# Patient Record
Sex: Female | Born: 1970 | Race: White | Hispanic: No | Smoking: Never smoker
Health system: Southern US, Community
[De-identification: ages and names within clinical notes are randomized; demographics above are authoritative.]

## PROBLEM LIST (undated history)

## (undated) DIAGNOSIS — R55 Syncope and collapse: Secondary | ICD-10-CM

## (undated) DIAGNOSIS — G43909 Migraine, unspecified, not intractable, without status migrainosus: Secondary | ICD-10-CM

## (undated) DIAGNOSIS — F329 Major depressive disorder, single episode, unspecified: Secondary | ICD-10-CM

## (undated) DIAGNOSIS — K900 Celiac disease: Secondary | ICD-10-CM

## (undated) HISTORY — DX: Major depressive disorder, single episode, unspecified: F32.9

## (undated) HISTORY — DX: Syncope and collapse: R55

## (undated) HISTORY — DX: Celiac disease: K90.0

## (undated) HISTORY — PX: CHOLECYSTECTOMY: SHX55

## (undated) HISTORY — DX: Migraine, unspecified, not intractable, without status migrainosus: G43.909

---

## 1998-04-26 ENCOUNTER — Other Ambulatory Visit: Admission: RE | Admit: 1998-04-26 | Discharge: 1998-04-26 | Payer: Self-pay | Admitting: Obstetrics and Gynecology

## 1999-06-27 ENCOUNTER — Other Ambulatory Visit: Admission: RE | Admit: 1999-06-27 | Discharge: 1999-06-27 | Payer: Self-pay | Admitting: Obstetrics and Gynecology

## 2000-09-05 ENCOUNTER — Other Ambulatory Visit: Admission: RE | Admit: 2000-09-05 | Discharge: 2000-09-05 | Payer: Self-pay | Admitting: Obstetrics and Gynecology

## 2001-10-06 ENCOUNTER — Other Ambulatory Visit: Admission: RE | Admit: 2001-10-06 | Discharge: 2001-10-06 | Payer: Self-pay | Admitting: Obstetrics and Gynecology

## 2002-09-10 ENCOUNTER — Other Ambulatory Visit: Admission: RE | Admit: 2002-09-10 | Discharge: 2002-09-10 | Payer: Self-pay | Admitting: Obstetrics and Gynecology

## 2002-12-22 ENCOUNTER — Other Ambulatory Visit: Admission: RE | Admit: 2002-12-22 | Discharge: 2002-12-22 | Payer: Self-pay | Admitting: Obstetrics and Gynecology

## 2004-04-24 ENCOUNTER — Other Ambulatory Visit: Admission: RE | Admit: 2004-04-24 | Discharge: 2004-04-24 | Payer: Self-pay | Admitting: Obstetrics and Gynecology

## 2005-01-09 ENCOUNTER — Ambulatory Visit (HOSPITAL_COMMUNITY): Admission: RE | Admit: 2005-01-09 | Discharge: 2005-01-09 | Payer: Self-pay | Admitting: Pediatrics

## 2005-02-08 ENCOUNTER — Encounter: Admission: RE | Admit: 2005-02-08 | Discharge: 2005-02-08 | Payer: Self-pay | Admitting: Gastroenterology

## 2005-02-12 ENCOUNTER — Encounter: Admission: RE | Admit: 2005-02-12 | Discharge: 2005-02-12 | Payer: Self-pay | Admitting: Gastroenterology

## 2005-02-19 ENCOUNTER — Ambulatory Visit (HOSPITAL_COMMUNITY): Admission: RE | Admit: 2005-02-19 | Discharge: 2005-02-19 | Payer: Self-pay | Admitting: Gastroenterology

## 2005-02-19 ENCOUNTER — Encounter (INDEPENDENT_AMBULATORY_CARE_PROVIDER_SITE_OTHER): Payer: Self-pay | Admitting: Specialist

## 2005-04-03 ENCOUNTER — Encounter: Admission: RE | Admit: 2005-04-03 | Discharge: 2005-04-03 | Payer: Self-pay | Admitting: Gastroenterology

## 2005-04-05 ENCOUNTER — Other Ambulatory Visit: Admission: RE | Admit: 2005-04-05 | Discharge: 2005-04-05 | Payer: Self-pay | Admitting: Obstetrics and Gynecology

## 2005-05-03 ENCOUNTER — Encounter: Admission: RE | Admit: 2005-05-03 | Discharge: 2005-05-03 | Payer: Self-pay | Admitting: Obstetrics and Gynecology

## 2005-05-10 ENCOUNTER — Encounter: Admission: RE | Admit: 2005-05-10 | Discharge: 2005-05-10 | Payer: Self-pay | Admitting: Gastroenterology

## 2005-11-05 ENCOUNTER — Encounter: Admission: RE | Admit: 2005-11-05 | Discharge: 2005-11-05 | Payer: Self-pay | Admitting: Obstetrics and Gynecology

## 2005-12-25 ENCOUNTER — Emergency Department (HOSPITAL_COMMUNITY): Admission: EM | Admit: 2005-12-25 | Discharge: 2005-12-25 | Payer: Self-pay | Admitting: Emergency Medicine

## 2005-12-28 ENCOUNTER — Emergency Department (HOSPITAL_COMMUNITY): Admission: EM | Admit: 2005-12-28 | Discharge: 2005-12-28 | Payer: Self-pay | Admitting: Emergency Medicine

## 2006-01-01 ENCOUNTER — Encounter (HOSPITAL_COMMUNITY): Admission: RE | Admit: 2006-01-01 | Discharge: 2006-02-05 | Payer: Self-pay | Admitting: Emergency Medicine

## 2006-02-12 ENCOUNTER — Other Ambulatory Visit: Admission: RE | Admit: 2006-02-12 | Discharge: 2006-02-12 | Payer: Self-pay | Admitting: Obstetrics and Gynecology

## 2006-04-09 ENCOUNTER — Other Ambulatory Visit: Admission: RE | Admit: 2006-04-09 | Discharge: 2006-04-09 | Payer: Self-pay | Admitting: Obstetrics and Gynecology

## 2006-05-16 ENCOUNTER — Encounter: Admission: RE | Admit: 2006-05-16 | Discharge: 2006-05-16 | Payer: Self-pay | Admitting: Obstetrics and Gynecology

## 2006-05-20 LAB — CONVERTED CEMR LAB: Pap Smear: NORMAL

## 2007-02-12 ENCOUNTER — Encounter: Payer: Self-pay | Admitting: Family Medicine

## 2008-08-31 ENCOUNTER — Ambulatory Visit: Payer: Self-pay | Admitting: Family Medicine

## 2008-08-31 DIAGNOSIS — F329 Major depressive disorder, single episode, unspecified: Secondary | ICD-10-CM

## 2008-08-31 DIAGNOSIS — F3289 Other specified depressive episodes: Secondary | ICD-10-CM | POA: Insufficient documentation

## 2008-08-31 HISTORY — DX: Major depressive disorder, single episode, unspecified: F32.9

## 2008-08-31 HISTORY — DX: Other specified depressive episodes: F32.89

## 2008-10-13 ENCOUNTER — Encounter: Payer: Self-pay | Admitting: Family Medicine

## 2008-10-13 DIAGNOSIS — K900 Celiac disease: Secondary | ICD-10-CM

## 2008-10-13 DIAGNOSIS — G43909 Migraine, unspecified, not intractable, without status migrainosus: Secondary | ICD-10-CM

## 2008-10-13 HISTORY — DX: Migraine, unspecified, not intractable, without status migrainosus: G43.909

## 2008-10-13 HISTORY — DX: Celiac disease: K90.0

## 2009-08-14 ENCOUNTER — Encounter: Admission: RE | Admit: 2009-08-14 | Discharge: 2009-08-14 | Payer: Self-pay | Admitting: Obstetrics and Gynecology

## 2010-03-12 ENCOUNTER — Telehealth: Payer: Self-pay | Admitting: Family Medicine

## 2010-03-12 ENCOUNTER — Ambulatory Visit: Payer: Self-pay | Admitting: Family Medicine

## 2010-03-25 ENCOUNTER — Emergency Department (HOSPITAL_COMMUNITY)
Admission: EM | Admit: 2010-03-25 | Discharge: 2010-03-25 | Payer: Self-pay | Source: Home / Self Care | Admitting: Emergency Medicine

## 2010-03-29 ENCOUNTER — Ambulatory Visit: Payer: Self-pay | Admitting: Family Medicine

## 2010-03-29 DIAGNOSIS — R55 Syncope and collapse: Secondary | ICD-10-CM | POA: Insufficient documentation

## 2010-03-29 HISTORY — DX: Syncope and collapse: R55

## 2010-06-10 ENCOUNTER — Encounter: Payer: Self-pay | Admitting: Obstetrics and Gynecology

## 2010-06-19 NOTE — Assessment & Plan Note (Signed)
Summary: POST ED VISIT (SYNCOPAL EPISODE) // RS   Vital Signs:  Patient profile:   40 year old female Weight:      150 pounds Temp:     98.3 degrees F oral BP sitting:   90 / 62  (left arm) BP standing:   96 / 68 Cuff size:   regular  Vitals Entered By: Sid Falcon LPN (March 29, 2010 1:32 PM)  Serial Vital Signs/Assessments:  Time      Position  BP       Pulse  Resp  Temp     By           Lying RA  100/68                         Evelena Peat MD           Standing  96/68                          Evelena Peat MD   History of Present Illness: Patient seen following recent syncopal episode. This occurred early November 6 sometime after midnight. Patient recalls getting up to go for a drink of water. She was in kitchen getting a glass and sometime between there and walking out into the hallway she lost consciousness. She does recall feeling dizzy, nauseated, and diaphoretic before passing out. Length of unconsciousness unknown but she thinks very brief.   When she came back around she had lower lip laceration and drywall pieces in her mouth. She eventually went back to bed and went to the emergency room later that morning around 9:30 AM. She had extensive workup as below  Patient had several labs all unremarkable-CBC, chemistries, CK enz.   CT scan of head and CT maxillofacial films unremarkable for any fracture or acute bleed.          no major dental injury.  no arrhythmias noted. No history of seizure. She doesn't recall any orthostatic changes. She tends to have low blood pressure. Does not take any regular medications.  She has done well since the injury. No recurrent symptoms.  Allergies: 1)  ! Hydrocodone-Acetaminophen (Hydrocodone-Acetaminophen)  Past History:  Past Medical History: Last updated: 10/13/2008 Depression Migraines celiac disease  Past Surgical History: Last updated: 08/31/2008 C-Section 1993  Family History: Last updated: 08/31/2008 Family  History Hypertension  parent Family History of Cardiovascular disorder  parent Breast cancer grandmother  Social History: Last updated: 08/31/2008 Occupation:  Fish farm manager Single Alcohol use-no PMH-FH-SH reviewed for relevance  Review of Systems  The patient denies anorexia, fever, weight loss, vision loss, chest pain, dyspnea on exertion, peripheral edema, prolonged cough, headaches, hemoptysis, abdominal pain, melena, hematochezia, and severe indigestion/heartburn.    Physical Exam  General:  Well-developed,well-nourished,in no acute distress; alert,appropriate and cooperative throughout examination Head:  Normocephalic and atraumatic without obvious abnormalities. No apparent alopecia or balding. Eyes:  pupils equal, pupils round, and pupils reactive to light.   Ears:  External ear exam shows no significant lesions or deformities.  Otoscopic examination reveals clear canals, tympanic membranes are intact bilaterally without bulging, retraction, inflammation or discharge. Hearing is grossly normal bilaterally. Mouth:  lower lip laceration healing well no signs of secondary infection. Neck:  No deformities, masses, or tenderness noted. Lungs:  Normal respiratory effort, chest expands symmetrically. Lungs are clear to auscultation, no crackles or wheezes. Heart:  normal rate, regular rhythm, no murmur, and no gallop.  Msk:  No deformity or scoliosis noted of thoracic or lumbar spine.   Extremities:  No clubbing, cyanosis, edema, or deformity noted with normal full range of motion of all joints.   Neurologic:  alert & oriented X3, cranial nerves II-XII intact, strength normal in all extremities, sensation intact to light touch, and DTRs symmetrical and normal.   Cervical Nodes:  No lymphadenopathy noted Psych:  normally interactive, good eye contact, not anxious appearing, and not depressed appearing.     Impression & Recommendations:  Problem # 1:  SYNCOPE  (ICD-780.2) ?vasovagal with preceeding symptoms of nausea, diaphoresis.  No recurrrence.  Nothing to sound like seizure. Workup reviewed.  pt to follow up immediatley for any recurrence.  Complete Medication List: 1)  Multi-b-plus Tabs (Multiple vitamins-minerals) .... Once daily 2)  Melatonin 3 Mg Caps (Melatonin) .... At bedtime 3)  Calcium + D 315-200 Mg-unit Tabs (Calcium citrate-vitamin d) .... Qd 4)  Flax Oil-fish Oil-borage Oil Caps (Flax oil-fish oil-borage oil) .... Once daily 5)  Amoxicillin-pot Clavulanate 875-125 Mg Tabs (Amoxicillin-pot clavulanate) .... One by mouth two times a day for 10 days   Orders Added: 1)  Est. Patient Level IV [16109]

## 2010-06-19 NOTE — Assessment & Plan Note (Signed)
Summary: Dog Bite/cb   Vital Signs:  Patient profile:   40 year old female Temp:     98.9 degrees F oral BP sitting:   110 / 80  (left arm) Cuff size:   regular  Vitals Entered By: Sid Falcon LPN (March 12, 2010 11:45 AM) CC: Walk-in, dog bite left forearm   History of Present Illness: Dog bite L wrist.  She works in Scientist, product/process development. Dog coming out of anesthesia.  Pt and dog have been vaccinated-for rabies. Deep puncture wound .  Last tetanus 2 years ago. Initial bleeding, now controlled.  Allergies: 1)  ! Hydrocodone-Acetaminophen (Hydrocodone-Acetaminophen)  Past History:  Past Medical History: Last updated: 10/13/2008 Depression Migraines celiac disease PMH reviewed for relevance  Physical Exam  General:  Well-developed,well-nourished,in no acute distress; alert,appropriate and cooperative throughout examination Lungs:  Normal respiratory effort, chest expands symmetrically. Lungs are clear to auscultation, no crackles or wheezes. Heart:  Normal rate and regular rhythm. S1 and S2 normal without gallop, murmur, click, rub or other extra sounds. Extremities:  L forearm about 8 cm prox to wrist has small <1cm nongaping horizontal laceration with no active bleeding.  Does have some mild bruising and edema. Full ROM wrist and all digits of hand. Neurologic:  sensation intact to light touch.     Impression & Recommendations:  Problem # 1:  DOG BITE (ICD-E906.0) no concerns for rabies (see HPI).  immunizations up to date.  Wound is thouroughly irrigated with normal saline and topical  antibiotic and dressing applied.  Watch closely for infection.  Start Augmentin promptly for any signs of infection. We explained rationale for not suturing since nongaping and fairly deep puncture wound.  Complete Medication List: 1)  Multi-b-plus Tabs (Multiple vitamins-minerals) .... Once daily 2)  Melatonin 3 Mg Caps (Melatonin) .... At bedtime 3)  Calcium + D 315-200 Mg-unit  Tabs (Calcium citrate-vitamin d) .... Qd 4)  Flax Oil-fish Oil-borage Oil Caps (Flax oil-fish oil-borage oil) .... Once daily 5)  Amoxicillin-pot Clavulanate 875-125 Mg Tabs (Amoxicillin-pot clavulanate) .... One by mouth two times a day for 10 days Prescriptions: AMOXICILLIN-POT CLAVULANATE 875-125 MG TABS (AMOXICILLIN-POT CLAVULANATE) one by mouth two times a day for 10 days  #20 x 0   Entered and Authorized by:   Evelena Peat MD   Signed by:   Evelena Peat MD on 03/12/2010   Method used:   Print then Give to Patient   RxID:   [id number]    Orders Added: 1)  Est. Patient Level III [14782]

## 2010-06-19 NOTE — Progress Notes (Signed)
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   Left message we returned call.  Rudy Jew, RN  March 12, 2010 11:19 AM  Initial call taken by: Rudy Jew, RN,  March 12, 2010 11:17 AM  Follow-up for Phone Call        Pt arrived for acute OV Follow-up by: Sid Falcon LPN,  March 12, 2010 12:12 PM

## 2010-07-31 LAB — DIFFERENTIAL
Basophils Absolute: 0 10*3/uL (ref 0.0–0.1)
Basophils Relative: 0 % (ref 0–1)
Eosinophils Absolute: 0.1 10*3/uL (ref 0.0–0.7)
Neutrophils Relative %: 67 % (ref 43–77)

## 2010-07-31 LAB — COMPREHENSIVE METABOLIC PANEL
ALT: 18 U/L (ref 0–35)
Alkaline Phosphatase: 52 U/L (ref 39–117)
BUN: 11 mg/dL (ref 6–23)
CO2: 27 mEq/L (ref 19–32)
GFR calc non Af Amer: >60 mL/min (ref >60)
Glucose, Bld: 99 mg/dL (ref 70–99)
Potassium: 3.6 mEq/L (ref 3.5–5.1)
Sodium: 140 mEq/L (ref 135–145)
Total Bilirubin: 0.8 mg/dL (ref 0.3–1.2)

## 2010-07-31 LAB — URINALYSIS, ROUTINE W REFLEX MICROSCOPIC
Glucose, UA: NEGATIVE mg/dL
Ketones, ur: NEGATIVE mg/dL
Leukocytes, UA: NEGATIVE
Protein, ur: NEGATIVE mg/dL
Urobilinogen, UA: 0.2 mg/dL (ref 0.0–1.0)

## 2010-07-31 LAB — POCT CARDIAC MARKERS: Myoglobin, poc: 54.1 ng/mL (ref 12–200)

## 2010-07-31 LAB — CBC
HCT: 41.4 % (ref 36.0–46.0)
Hemoglobin: 14.4 g/dL (ref 12.0–15.0)
MCV: 91.6 fL (ref 78.0–100.0)
WBC: 7.3 10*3/uL (ref 4.0–10.5)

## 2010-07-31 LAB — URINE MICROSCOPIC-ADD ON

## 2010-08-03 ENCOUNTER — Telehealth: Payer: Self-pay | Admitting: *Deleted

## 2010-08-03 NOTE — Telephone Encounter (Signed)
patient  Is calling because she has been exposed to a rabid dog 08-01-10.  She did receive vaccination 2007.  Should she be tested or re-immunized?

## 2010-08-05 NOTE — Telephone Encounter (Signed)
I attempted to call pt and discuss on 08-03-10 but unable to reach.  I left message on machine.  Need more details regarding her "exposure".  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

## 2010-08-06 NOTE — Telephone Encounter (Signed)
Order written for pt

## 2010-08-06 NOTE — Telephone Encounter (Signed)
2nd message left on pt cell phone to call with additional information, or may schedule OV to evaluate further

## 2010-08-06 NOTE — Telephone Encounter (Signed)
Pt here with daughter for daughter OV, pt requesting order to draw a rabies titer, written needed for [facility]

## 2010-08-07 ENCOUNTER — Other Ambulatory Visit: Payer: Self-pay | Admitting: Family Medicine

## 2010-08-07 ENCOUNTER — Telehealth: Payer: Self-pay | Admitting: Family Medicine

## 2010-08-07 NOTE — Telephone Encounter (Signed)
Harland German is needing to get dx code for rabies titer. Need asap. Can't send out until it is rcvd.

## 2010-08-08 NOTE — Telephone Encounter (Signed)
Use ICD-9 code 879.8

## 2010-08-08 NOTE — Telephone Encounter (Signed)
Tried to inform Lab, however there already sent the specimin to lab yesterday.  Tried to provide IDC code to Customer Service, on hold for 10 min, no luck.

## 2010-08-08 NOTE — Telephone Encounter (Signed)
Please advise 

## 2010-08-22 ENCOUNTER — Telehealth: Payer: Self-pay | Admitting: *Deleted

## 2010-08-22 NOTE — Telephone Encounter (Signed)
I called the patient to let her know the TAT on this test is 30 days.

## 2010-08-22 NOTE — Telephone Encounter (Signed)
Gwen, can you see if this has been completed?  I thought I had seen a result but cannot find.

## 2010-08-22 NOTE — Telephone Encounter (Signed)
Pt is calling to ask for lab results for rabies titre?

## 2010-09-06 NOTE — Progress Notes (Signed)
Quick Note:  Pt informed ______ 

## 2010-09-06 NOTE — Progress Notes (Signed)
Quick Note:  Copy of lab result sent to pt home ______

## 2010-09-10 ENCOUNTER — Telehealth: Payer: Self-pay | Admitting: *Deleted

## 2010-09-10 NOTE — Telephone Encounter (Signed)
Tried to call pt back, left a message. Advise please

## 2010-09-10 NOTE — Telephone Encounter (Addendum)
Pt is calling to ask if Dr. Caryl Never would administer rabies injection to her????  She could bring it from her vet clinic??  Would like to speak to [employer].

## 2010-09-11 NOTE — Telephone Encounter (Signed)
I spoke with office manager Cinda Quest.  We are not set up to provide this service to pt.  Recommended she call an urgent care or Occupational Health facility to assist with the rabies vaccine.  Detailed message left on pt cell phone VM

## 2010-10-05 NOTE — Op Note (Signed)
NAMEPrisilla, Amy Kaufman                  ACCOUNT NO.:  [id number]   MEDICAL RECORD NO.:  [id number]          PATIENT TYPE:  AMB   LOCATION:  ENDO                         FACILITY:  [facility]   PHYSICIAN:  Petra Kuba, M.D.    DATE OF BIRTH:  Jul 28, 1970   DATE OF PROCEDURE:  02/19/2005  DATE OF DISCHARGE:                                 OPERATIVE REPORT   PROCEDURE:  EGD with biopsy.   INDICATIONS:  Abdominal pain not responding to usual medicines.  Consent was  signed after risks, benefits, methods, options thoroughly discussed in the  office.   MEDICINES USED:  Demerol 100, Versed 10.   PROCEDURE:  The video endoscope was inserted by direct vision.  Esophagus  was normal.  Scope passed into the stomach.  There was a tiny hiatal hernia  seen.  Some bilious material was suctioned.  The scope passed through a  normal antrum, normal pylorus, into a normal duodenal bulb and around the C-  loop to a second portion of the duodenum which did have some mild  scalloping, and a few biopsies were obtained.  We could not advance any  further down the duodenum due to looping.  The scope was slowly withdrawn.  Again, a good look at the bulb was normal.  Scope was withdrawn back to the  stomach and retroflexed.  Angularis, cardia, fundus, lesser and greater  curve were all normal except for some minimal gastritis.  Straight  visualization of the stomach did not reveal any additional findings.  A few  biopsies of the stomach, both the antrum and proximal stomach were obtained  to rule out Helicobacter.  Air was suctioned, scope slowly withdrawn.  Again, a good look at the esophagus was normal.  The scope was removed.  The  patient tolerated the procedure well, and there was no obvious immediate  complication.   ENDOSCOPIC DIAGNOSES:  1.  Tiny hiatal hernia.  2.  Minimal gastritis, status post biopsy.  3.  Minimal duodenitis versus scalloping, status post biopsy.   PLAN:  Await pathology.  Consider  laparoscope next or rechecking CT to see  if the nodes are getting better.  She has already tried Carafate and some  pump inhibitors __________.  The Librax seems to be taking the edge off.  Might need to try some Cytotec or possibly even a nonsteroidal pump  inhibitor to see if this is some abdominal wall inflammation.  Will touch  base about how she is doing with the biopsies and decide how to proceed at  that juncture.           ______________________________  Petra Kuba, M.D.     MEM/MEDQ  D:  02/19/2005  T:  02/19/2005  Job:  [id number]   cc:   Francoise Schaumann. Halford Chessman  Fax: [phone]

## 2011-04-26 ENCOUNTER — Ambulatory Visit (INDEPENDENT_AMBULATORY_CARE_PROVIDER_SITE_OTHER): Payer: 59 | Admitting: Family Medicine

## 2011-04-26 DIAGNOSIS — Z23 Encounter for immunization: Secondary | ICD-10-CM

## 2011-09-05 ENCOUNTER — Encounter: Payer: Self-pay | Admitting: Family Medicine

## 2011-09-05 ENCOUNTER — Ambulatory Visit (INDEPENDENT_AMBULATORY_CARE_PROVIDER_SITE_OTHER): Payer: 59 | Admitting: Family Medicine

## 2011-09-05 DIAGNOSIS — R252 Cramp and spasm: Secondary | ICD-10-CM

## 2011-09-05 DIAGNOSIS — R110 Nausea: Secondary | ICD-10-CM

## 2011-09-05 DIAGNOSIS — R109 Unspecified abdominal pain: Secondary | ICD-10-CM

## 2011-09-05 LAB — LIPASE: Lipase: 28 U/L (ref 11.0–59.0)

## 2011-09-05 LAB — BASIC METABOLIC PANEL
BUN: 12 mg/dL (ref 6–23)
Calcium: 8.5 mg/dL (ref 8.4–10.5)
Creatinine, Ser: 0.6 mg/dL (ref 0.4–1.2)
GFR: 107.52 mL/min (ref >60.00)

## 2011-09-05 LAB — CBC WITH DIFFERENTIAL/PLATELET
Basophils Absolute: 0.1 10*3/uL (ref 0.0–0.1)
Eosinophils Relative: 3 % (ref 0.0–5.0)
Monocytes Relative: 11.5 % (ref 3.0–12.0)
Neutrophils Relative %: 58.1 % (ref 43.0–77.0)
Platelets: 249 10*3/uL (ref 150.0–400.0)
RDW: 13 % (ref 11.5–14.6)
WBC: 7.2 10*3/uL (ref 4.5–10.5)

## 2011-09-05 LAB — MAGNESIUM: Magnesium: 1.9 mg/dL (ref 1.5–2.5)

## 2011-09-05 MED ORDER — ONDANSETRON 8 MG PO TBDP: 8.0000 mg | ORAL_TABLET | Freq: Three times a day (TID) | ORAL | Status: AC | PRN

## 2011-09-05 NOTE — Progress Notes (Signed)
  Subjective:    Patient ID: Amy Kaufman, female    DOB: 11/15/1970, 41 y.o.   MRN: [id number]  HPI  Acute visit. Onset 4 days ago of nausea and had vomiting on Monday and Tuesday but none since then. Her nausea comes in waves. She had diarrhea initially with very dark also black stools. Possible mild change in consistency. Diarrhea is now resolved. She also had for past month or so frequently cramps especially right leg. Good fluid consumption. She does not take any regular medications. No sick exposures. Denies any fever but has had some sweats off and on.  She has had some mild midepigastric pain. Previous cholecystectomy. No history of peptic ulcer disease. She has history of celiac disease.  Past Medical History  Diagnosis Date  . DEPRESSION 08/31/2008    Qualifier: Diagnosis of  By: Gabriel Rung LPN, Harriett Sine    . MIGRAINE HEADACHE 10/13/2008    Qualifier: Diagnosis of  By: Everett Graff    . Celiac disease 10/13/2008    Qualifier: Diagnosis of  By: Everett Graff    . SYNCOPE 03/29/2010    Qualifier: Diagnosis of  By: Caryl Never MD, Bruce     Past Surgical History  Procedure Date  . Cesarean section     reports that she has never smoked. She does not have any smokeless tobacco history on file. Her alcohol and drug histories not on file. family history includes Cancer in her other; Heart disease in her other; and Hypertension in her other. Allergies  Allergen Reactions  . Hydrocodone-Acetaminophen     iitching      Review of Systems  Constitutional: Positive for diaphoresis, appetite change and fatigue. Negative for fever and unexpected weight change.  Respiratory: Negative for cough and shortness of breath.   Gastrointestinal: Positive for nausea and abdominal pain. Negative for blood in stool.  Genitourinary: Negative for dysuria.  Skin: Negative for rash.  Neurological: Negative for dizziness and syncope.  Hematological: Negative for adenopathy.       Objective:   Physical Exam  Constitutional: She appears well-developed and well-nourished.  Neck: Neck supple.  Cardiovascular: Normal rate and regular rhythm.   Pulmonary/Chest: Effort normal and breath sounds normal. No respiratory distress. She has no wheezes. She has no rales.  Abdominal: Soft. She exhibits no distension and no mass. There is no rebound and no guarding.       Mildly tender epigastric region.  Genitourinary:       Rectal exam reveals no mass. Brown Hemoccult negative stool  Musculoskeletal: She exhibits no edema.  Lymphadenopathy:    She has no cervical adenopathy.          Assessment & Plan:  #1 nausea with resolving diarrhea. Question gastroenteritis. Zofran as needed for nausea. Obtain screening lab work with a recent vomiting and diarrhea and extreme fatigue  #2 leg cramps. Obtain electrolytes as above include magnesium. These symptoms have preceded her recent GI symptoms. She appears to be hydrating well. #3 epigastric pain possibly related to #1. Doubt pancreatitis. Doubt peptic ulcer disease with patient having no significant risk factors. She's had previous cholecystectomy

## 2011-09-05 NOTE — Patient Instructions (Signed)
Viral Gastroenteritis Viral gastroenteritis is also known as stomach flu. This condition affects the stomach and intestinal tract. It can cause sudden diarrhea and vomiting. The illness typically lasts 3 to 8 days. Most people develop an immune response that eventually gets rid of the virus. While this natural response develops, the virus can make you quite ill. CAUSES  Many different viruses can cause gastroenteritis, such as rotavirus or noroviruses. You can catch one of these viruses by consuming contaminated food or water. You may also catch a virus by sharing utensils or other personal items with an infected person or by touching a contaminated surface. SYMPTOMS  The most common symptoms are diarrhea and vomiting. These problems can cause a severe loss of body fluids (dehydration) and a body salt (electrolyte) imbalance. Other symptoms may include:  Fever.   Headache.   Fatigue.   Abdominal pain.  DIAGNOSIS  Your caregiver can usually diagnose viral gastroenteritis based on your symptoms and a physical exam. A stool sample may also be taken to test for the presence of viruses or other infections. TREATMENT  This illness typically goes away on its own. Treatments are aimed at rehydration. The most serious cases of viral gastroenteritis involve vomiting so severely that you are not able to keep fluids down. In these cases, fluids must be given through an intravenous line (IV). HOME CARE INSTRUCTIONS   Drink enough fluids to keep your urine clear or pale yellow. Drink small amounts of fluids frequently and increase the amounts as tolerated.   Ask your caregiver for specific rehydration instructions.   Avoid:   Foods high in sugar.   Alcohol.   Carbonated drinks.   Tobacco.   Juice.   Caffeine drinks.   Extremely hot or cold fluids.   Fatty, greasy foods.   Too much intake of anything at one time.   Dairy products until 24 to 48 hours after diarrhea stops.   You may  consume probiotics. Probiotics are active cultures of beneficial bacteria. They may lessen the amount and number of diarrheal stools in adults. Probiotics can be found in yogurt with active cultures and in supplements.   Wash your hands well to avoid spreading the virus.   Only take over-the-counter or prescription medicines for pain, discomfort, or fever as directed by your caregiver. Do not give aspirin to children. Antidiarrheal medicines are not recommended.   Ask your caregiver if you should continue to take your regular prescribed and over-the-counter medicines.   Keep all follow-up appointments as directed by your caregiver.  SEEK IMMEDIATE MEDICAL CARE IF:   You are unable to keep fluids down.   You do not urinate at least once every 6 to 8 hours.   You develop shortness of breath.   You notice blood in your stool or vomit. This may look like coffee grounds.   You have abdominal pain that increases or is concentrated in one small area (localized).   You have persistent vomiting or diarrhea.   You have a fever.   The patient is a child younger than 3 months, and he or she has a fever.   The patient is a child older than 3 months, and he or she has a fever and persistent symptoms.   The patient is a child older than 3 months, and he or she has a fever and symptoms suddenly get worse.   The patient is a baby, and he or she has no tears when crying.  MAKE SURE YOU:     Understand these instructions.   Will watch your condition.   Will get help right away if you are not doing well or get worse.  Document Released: 05/06/2005 Document Revised: 04/25/2011 Document Reviewed: 02/20/2011 [employer] Patient Information 2012 [employer]. 

## 2011-09-06 NOTE — Progress Notes (Signed)
Quick Note:  Pt informed ______ 

## 2011-09-17 ENCOUNTER — Ambulatory Visit (INDEPENDENT_AMBULATORY_CARE_PROVIDER_SITE_OTHER): Payer: 59 | Admitting: Family Medicine

## 2011-09-17 ENCOUNTER — Encounter: Payer: Self-pay | Admitting: Family Medicine

## 2011-09-17 VITALS — BP 90/62 | Temp 99.2°F | Wt 145.0 lb

## 2011-09-17 DIAGNOSIS — R1013 Epigastric pain: Secondary | ICD-10-CM

## 2011-09-17 DIAGNOSIS — K900 Celiac disease: Secondary | ICD-10-CM

## 2011-09-17 MED ORDER — PANTOPRAZOLE SODIUM 40 MG PO TBEC: 40.0000 mg | DELAYED_RELEASE_TABLET | Freq: Every day | ORAL | Status: DC

## 2011-09-17 NOTE — Progress Notes (Addendum)
  Subjective:    Patient ID: Amy Kaufman, female    DOB: 07-11-1970, 41 y.o.   MRN: [id number]  HPI  Patient seen with persistent somewhat intermittent epigastric pain. She had what sounded like gastroenteritis couple weeks ago. She's had some persistent diarrhea which is intermittent and nonbloody. She has sharp to burning epigastric pain. No clear aggravating factors. No alleviating factors. Has not tried any antacids. History of celiac disease and had somewhat similar flareup several years ago when first diagnosed. She has followed gluten-free diet for the most part. Has had good appetite. No recent antibiotics. Denies any fever. No hematemesis. Still has some nausea occasionally but no vomiting. Previous cholecystectomy.  Is taking Zofran which does help occasionally with nausea. Probiotics have not helped her symptoms.  Location of pain is epigastric. No radiation.  Recent lab work including basic metabolic panel, CBC, and lipase were unremarkable.  Past Medical History  Diagnosis Date  . DEPRESSION 08/31/2008    Qualifier: Diagnosis of  By: Gabriel Rung LPN, Harriett Sine    . MIGRAINE HEADACHE 10/13/2008    Qualifier: Diagnosis of  By: Everett Graff    . Celiac disease 10/13/2008    Qualifier: Diagnosis of  By: Everett Graff    . SYNCOPE 03/29/2010    Qualifier: Diagnosis of  By: Caryl Never MD, Suzi Hernan     Past Surgical History  Procedure Date  . Cesarean section     reports that she has never smoked. She does not have any smokeless tobacco history on file. Her alcohol and drug histories not on file. family history includes Cancer in her other; Heart disease in her other; and Hypertension in her other. Allergies  Allergen Reactions  . Hydrocodone-Acetaminophen     iitching      Review of Systems  Constitutional: Negative for fever, chills, appetite change and fatigue.  Respiratory: Negative for shortness of breath.   Gastrointestinal: Positive for nausea, abdominal pain and diarrhea.  Negative for vomiting, constipation and blood in stool.  Genitourinary: Negative for dysuria.  Neurological: Negative for dizziness.  Hematological: Negative for adenopathy.       Objective:   Physical Exam  Constitutional: She appears well-developed and well-nourished.  HENT:  Mouth/Throat: Oropharynx is clear and moist.  Cardiovascular: Normal rate and regular rhythm.   Pulmonary/Chest: Effort normal and breath sounds normal. No respiratory distress. She has no wheezes. She has no rales.  Abdominal: Soft. Bowel sounds are normal. She exhibits no distension and no mass. There is tenderness (epigastric). There is no rebound and no guarding.          Assessment & Plan:  Patient presents with intermittent epigastric pain and intermittent nonbloody diarrhea. History of celiac disease. No specific risk factors for peptic ulcer disease. Trial protonix 40 mg daily and schedule upper GI. Consider GI referral if not responding to above.  Upper GI reveals mild reflux otherwise normal. Symptoms remain intermittent. She has history of celiac disease but has been trying to follow gluten-free diet. She remains upper chronic 40 mg once daily. Will proceed with GI referral at patient request Dr. Lina Sar.

## 2011-09-17 NOTE — Patient Instructions (Signed)
Continue probiotic Follow up promptly for any fever, recurrent vomiting, or worsening pain

## 2011-09-24 ENCOUNTER — Other Ambulatory Visit: Payer: Self-pay | Admitting: Family Medicine

## 2011-09-24 ENCOUNTER — Ambulatory Visit (HOSPITAL_COMMUNITY)
Admission: RE | Admit: 2011-09-24 | Discharge: 2011-09-24 | Disposition: A | Discharge status: Home / Self Care | Payer: 59 | Source: Ambulatory Visit | Attending: Family Medicine | Admitting: Family Medicine

## 2011-09-24 DIAGNOSIS — K900 Celiac disease: Secondary | ICD-10-CM

## 2011-09-24 DIAGNOSIS — R1013 Epigastric pain: Secondary | ICD-10-CM

## 2011-09-25 NOTE — Progress Notes (Signed)
Addended by: Kristian Covey on: 09/25/2011 05:54 PM   Modules accepted: Orders

## 2011-12-06 ENCOUNTER — Ambulatory Visit: Payer: 59 | Admitting: Internal Medicine

## 2012-01-06 ENCOUNTER — Telehealth: Payer: Self-pay | Admitting: Family Medicine

## 2012-01-06 NOTE — Telephone Encounter (Signed)
Opened in error

## 2012-01-07 ENCOUNTER — Ambulatory Visit (INDEPENDENT_AMBULATORY_CARE_PROVIDER_SITE_OTHER): Payer: 59 | Admitting: Family Medicine

## 2012-01-07 ENCOUNTER — Encounter: Payer: Self-pay | Admitting: Family Medicine

## 2012-01-07 VITALS — BP 110/70 | Temp 98.5°F | Wt 150.0 lb

## 2012-01-07 DIAGNOSIS — M25569 Pain in unspecified knee: Secondary | ICD-10-CM

## 2012-01-07 DIAGNOSIS — M25561 Pain in right knee: Secondary | ICD-10-CM

## 2012-01-07 MED ORDER — DICLOFENAC SODIUM 1 % TD GEL: 4.0000 g | Freq: Four times a day (QID) | TRANSDERMAL | Status: DC

## 2012-01-07 NOTE — Progress Notes (Signed)
  Subjective:    Patient ID: Amy Kaufman, female    DOB: Apr 07, 1971, 41 y.o.   MRN: [id number]  HPI  Right knee pain. Off and on for years but especially since last Thursday. No specific injury. Job requires while lifting and squatting. She's had perhaps mild edema. Medial location. Has tried ice without much relief. Cannot take nonsteroidals secondary to GI issues. Has never had peptic ulcer disease but stomach upset with ibuprofen. Has not noted any redness. No warmth. No locking or giving way. Pain is moderate and occasionally noted even at night with rest. Has seen orthopedists in the past and reportedly had plain x-rays 2 years ago which did not show any degenerative changes. Has never had MRI scan.   Review of Systems  Constitutional: Negative for fever and chills.  Musculoskeletal: Positive for gait problem.       Objective:   Physical Exam  Constitutional: She appears well-developed and well-nourished.  Cardiovascular: Normal rate and regular rhythm.   Musculoskeletal:       Right knee reveals no effusion. No warmth. Full range of motion. Moderate medial joint line tenderness. Ligament testing normal. No patellar tenderness.          Assessment & Plan:  Recurrent right medial knee pain. Suspect meniscal. Intolerant of nonsteroidals. Continue icing, especially after work. Voltaren gel 3-4 times daily. Continue knee brace. Suggested some knee exercises. Consider either orthopedic referral or imaging studies in 2 weeks if no better

## 2012-01-10 ENCOUNTER — Telehealth: Payer: Self-pay | Admitting: Family Medicine

## 2012-01-10 DIAGNOSIS — M25561 Pain in right knee: Secondary | ICD-10-CM

## 2012-01-10 NOTE — Telephone Encounter (Signed)
I recommend MRI to further assess.  Will set up

## 2012-01-10 NOTE — Telephone Encounter (Signed)
Pt was seen on Tuesday about R knee pain it is not getting any better almost seems worse. Pt is in more pain and has slightly more swelling and is having issues walking  and would like to know what she needs to do next

## 2012-01-10 NOTE — Telephone Encounter (Signed)
Patient is aware 

## 2012-01-14 ENCOUNTER — Telehealth: Payer: Self-pay | Admitting: Family Medicine

## 2012-01-14 DIAGNOSIS — M25561 Pain in right knee: Secondary | ICD-10-CM

## 2012-01-14 NOTE — Telephone Encounter (Signed)
Pt call back and is requesting a referral to an orthopedic

## 2012-01-14 NOTE — Telephone Encounter (Signed)
I'll be happy to refer. If we are making referral to orthopedist I will hold on trying to get prior approval for MRI this time Let her know I am making referral.

## 2012-01-15 NOTE — Telephone Encounter (Signed)
Pt informed. She already saw a PA at [facility] under Dr Thomasena Edis yesterday.  She received a cort injection and has F/U in 2 weeks. FYI

## 2012-02-12 ENCOUNTER — Telehealth: Payer: Self-pay | Admitting: Family Medicine

## 2012-02-12 ENCOUNTER — Ambulatory Visit (INDEPENDENT_AMBULATORY_CARE_PROVIDER_SITE_OTHER): Payer: 59

## 2012-02-12 DIAGNOSIS — Z23 Encounter for immunization: Secondary | ICD-10-CM

## 2012-02-12 NOTE — Telephone Encounter (Signed)
Pt called req status of disability form. Form was brought to [facility] on 9/12 or 9/13. Pls call.

## 2012-02-12 NOTE — Telephone Encounter (Signed)
I do not see a disability form scanned to chart yet.  Do you remember doing it?

## 2012-02-12 NOTE — Telephone Encounter (Signed)
Pt will pick up completed form, scanned copy to chart

## 2012-02-12 NOTE — Telephone Encounter (Signed)
Already completed

## 2013-02-05 ENCOUNTER — Other Ambulatory Visit: Payer: Self-pay | Admitting: Obstetrics and Gynecology

## 2013-02-11 ENCOUNTER — Inpatient Hospital Stay (HOSPITAL_COMMUNITY): Admission: RE | Admit: 2013-02-11 | Payer: 59 | Source: Ambulatory Visit

## 2013-02-26 ENCOUNTER — Ambulatory Visit (HOSPITAL_COMMUNITY)
Admission: RE | Admit: 2013-02-26 | Payer: BC Managed Care – PPO | Source: Ambulatory Visit | Admitting: Obstetrics and Gynecology

## 2013-02-26 ENCOUNTER — Encounter (HOSPITAL_COMMUNITY): Admission: RE | Payer: Self-pay | Source: Ambulatory Visit

## 2013-02-26 SURGERY — LIGATION, FALLOPIAN TUBE, LAPAROSCOPIC
Anesthesia: Choice | Laterality: Bilateral

## 2014-10-13 ENCOUNTER — Emergency Department (HOSPITAL_COMMUNITY)
Admission: EM | Admit: 2014-10-13 | Discharge: 2014-10-13 | Disposition: A | Discharge status: Home / Self Care | Payer: BLUE CROSS/BLUE SHIELD | Attending: Emergency Medicine | Admitting: Emergency Medicine

## 2014-10-13 ENCOUNTER — Encounter (HOSPITAL_COMMUNITY): Payer: Self-pay | Admitting: *Deleted

## 2014-10-13 ENCOUNTER — Emergency Department (HOSPITAL_COMMUNITY): Payer: BLUE CROSS/BLUE SHIELD

## 2014-10-13 DIAGNOSIS — Z791 Long term (current) use of non-steroidal anti-inflammatories (NSAID): Secondary | ICD-10-CM | POA: Insufficient documentation

## 2014-10-13 DIAGNOSIS — R079 Chest pain, unspecified: Secondary | ICD-10-CM | POA: Diagnosis not present

## 2014-10-13 DIAGNOSIS — Z79899 Other long term (current) drug therapy: Secondary | ICD-10-CM | POA: Insufficient documentation

## 2014-10-13 DIAGNOSIS — Z8679 Personal history of other diseases of the circulatory system: Secondary | ICD-10-CM | POA: Diagnosis not present

## 2014-10-13 DIAGNOSIS — Z8719 Personal history of other diseases of the digestive system: Secondary | ICD-10-CM | POA: Insufficient documentation

## 2014-10-13 DIAGNOSIS — R5383 Other fatigue: Secondary | ICD-10-CM | POA: Diagnosis not present

## 2014-10-13 DIAGNOSIS — F419 Anxiety disorder, unspecified: Secondary | ICD-10-CM | POA: Insufficient documentation

## 2014-10-13 LAB — COMPREHENSIVE METABOLIC PANEL
ALK PHOS: 48 U/L (ref 38–126)
ALT: 23 U/L (ref 14–54)
ANION GAP: 8 (ref 5–15)
AST: 24 U/L (ref 15–41)
Albumin: 3.9 g/dL (ref 3.5–5.0)
BILIRUBIN TOTAL: 0.7 mg/dL (ref 0.3–1.2)
BUN: 13 mg/dL (ref 6–20)
CALCIUM: 8.7 mg/dL — AB (ref 8.9–10.3)
CO2: 21 mmol/L — ABNORMAL LOW (ref 22–32)
Chloride: 110 mmol/L (ref 101–111)
Creatinine, Ser: 0.65 mg/dL (ref 0.44–1.00)
GFR calc Af Amer: >60 mL/min (ref >60)
GLUCOSE: 107 mg/dL — AB (ref 65–99)
POTASSIUM: 4.3 mmol/L (ref 3.5–5.1)
SODIUM: 139 mmol/L (ref 135–145)
Total Protein: 6.6 g/dL (ref 6.5–8.1)

## 2014-10-13 LAB — CBC WITH DIFFERENTIAL/PLATELET
BASOS ABS: 0.1 10*3/uL (ref 0.0–0.1)
Basophils Relative: 1 % (ref 0–1)
EOS PCT: 2 % (ref 0–5)
Eosinophils Absolute: 0.2 10*3/uL (ref 0.0–0.7)
HEMATOCRIT: 41.8 % (ref 36.0–46.0)
HEMOGLOBIN: 14.3 g/dL (ref 12.0–15.0)
LYMPHS ABS: 1.4 10*3/uL (ref 0.7–4.0)
Lymphocytes Relative: 20 % (ref 12–46)
MCH: 30.8 pg (ref 26.0–34.0)
MCHC: 34.2 g/dL (ref 30.0–36.0)
MCV: 89.9 fL (ref 78.0–100.0)
Monocytes Absolute: 0.7 10*3/uL (ref 0.1–1.0)
Monocytes Relative: 10 % (ref 3–12)
NEUTROS ABS: 4.7 10*3/uL (ref 1.7–7.7)
NEUTROS PCT: 67 % (ref 43–77)
Platelets: 278 10*3/uL (ref 150–400)
RBC: 4.65 MIL/uL (ref 3.87–5.11)
RDW: 12.7 % (ref 11.5–15.5)
WBC: 7.1 10*3/uL (ref 4.0–10.5)

## 2014-10-13 LAB — TSH: TSH: 1.192 u[IU]/mL (ref 0.350–4.500)

## 2014-10-13 LAB — D-DIMER, QUANTITATIVE: D-Dimer, Quant: 0.3 ug/mL-FEU (ref 0.00–0.48)

## 2014-10-13 LAB — TROPONIN I: Troponin I: <0.03 ng/mL (ref <0.031)

## 2014-10-13 NOTE — Discharge Instructions (Signed)
Chest Pain (Nonspecific) °It is often hard to give a specific diagnosis for the cause of chest pain. There is always a chance that your pain could be related to something serious, such as a heart attack or a blood clot in the lungs. You need to follow up with your health care provider for further evaluation. °CAUSES  °· Heartburn. °· Pneumonia or bronchitis. °· Anxiety or stress. °· Inflammation around your heart (pericarditis) or lung (pleuritis or pleurisy). °· A blood clot in the lung. °· A collapsed lung (pneumothorax). It can develop suddenly on its own (spontaneous pneumothorax) or from trauma to the chest. °· Shingles infection (herpes zoster virus). °The chest wall is composed of bones, muscles, and cartilage. Any of these can be the source of the pain. °· The bones can be bruised by injury. °· The muscles or cartilage can be strained by coughing or overwork. °· The cartilage can be affected by inflammation and become sore (costochondritis). °DIAGNOSIS  °Lab tests or other studies may be needed to find the cause of your pain. Your health care provider may have you take a test called an ambulatory electrocardiogram (ECG). An ECG records your heartbeat patterns over a 24-hour period. You may also have other tests, such as: °· Transthoracic echocardiogram (TTE). During echocardiography, sound waves are used to evaluate how blood flows through your heart. °· Transesophageal echocardiogram (TEE). °· Cardiac monitoring. This allows your health care provider to monitor your heart rate and rhythm in real time. °· Holter monitor. This is a portable device that records your heartbeat and can help diagnose heart arrhythmias. It allows your health care provider to track your heart activity for several days, if needed. °· Stress tests by exercise or by giving medicine that makes the heart beat faster. °TREATMENT  °· Treatment depends on what may be causing your chest pain. Treatment may include: °· Acid blockers for  heartburn. °· Anti-inflammatory medicine. °· Pain medicine for inflammatory conditions. °· Antibiotics if an infection is present. °· You may be advised to change lifestyle habits. This includes stopping smoking and avoiding alcohol, caffeine, and chocolate. °· You may be advised to keep your head raised (elevated) when sleeping. This reduces the chance of acid going backward from your stomach into your esophagus. °Most of the time, nonspecific chest pain will improve within 2-3 days with rest and mild pain medicine.  °HOME CARE INSTRUCTIONS  °· If antibiotics were prescribed, take them as directed. Finish them even if you start to feel better. °· For the next few days, avoid physical activities that bring on chest pain. Continue physical activities as directed. °· Do not use any tobacco products, including cigarettes, chewing tobacco, or electronic cigarettes. °· Avoid drinking alcohol. °· Only take medicine as directed by your health care provider. °· Follow your health care provider's suggestions for further testing if your chest pain does not go away. °· Keep any follow-up appointments you made. If you do not go to an appointment, you could develop lasting (chronic) problems with pain. If there is any problem keeping an appointment, call to reschedule. °SEEK MEDICAL CARE IF:  °· Your chest pain does not go away, even after treatment. °· You have a rash with blisters on your chest. °· You have a fever. °SEEK IMMEDIATE MEDICAL CARE IF:  °· You have increased chest pain or pain that spreads to your arm, neck, jaw, back, or abdomen. °· You have shortness of breath. °· You have an increasing cough, or you cough   up blood. °· You have severe back or abdominal pain. °· You feel nauseous or vomit. °· You have severe weakness. °· You faint. °· You have chills. °This is an emergency. Do not wait to see if the pain will go away. Get medical help at once. Call your local emergency services (911 in [place] Do not drive  yourself to the hospital. °MAKE SURE YOU:  °· Understand these instructions. °· Will watch your condition. °· Will get help right away if you are not doing well or get worse. °Document Released: 02/13/2005 Document Revised: 05/11/2013 Document Reviewed: 12/10/2007 °ExitCare® Patient Information ©2015 [employer]. This information is not intended to replace advice given to you by your health care provider. Make sure you discuss any questions you have with your health care provider. °Fatigue °Fatigue is a feeling of tiredness, lack of energy, lack of motivation, or feeling tired all the time. Having enough rest, good nutrition, and reducing stress will normally reduce fatigue. Consult your caregiver if it persists. The nature of your fatigue will help your caregiver to find out its cause. The treatment is based on the cause.  °CAUSES  °There are many causes for fatigue. Most of the time, fatigue can be traced to one or more of your habits or routines. Most causes fit into one or more of three general areas. They are: °Lifestyle problems °· Sleep disturbances. °· Overwork. °· Physical exertion. °· Unhealthy habits. °¨ Poor eating habits or eating disorders. °¨ Alcohol and/or drug use . °¨ Lack of proper nutrition (malnutrition). °Psychological problems °· Stress and/or anxiety problems. °· Depression. °· Grief. °· Boredom. °Medical Problems or Conditions °· Anemia. °· Pregnancy. °· Thyroid gland problems. °· Recovery from major surgery. °· Continuous pain. °· Emphysema or asthma that is not well controlled °· Allergic conditions. °· Diabetes. °· Infections (such as mononucleosis). °· Obesity. °· Sleep disorders, such as sleep apnea. °· Heart failure or other heart-related problems. °· Cancer. °· Kidney disease. °· Liver disease. °· Effects of certain medicines such as antihistamines, cough and cold remedies, prescription pain medicines, heart and blood pressure medicines, drugs used for treatment of cancer, and some  antidepressants. °SYMPTOMS  °The symptoms of fatigue include:  °· Lack of energy. °· Lack of drive (motivation). °· Drowsiness. °· Feeling of indifference to the surroundings. °DIAGNOSIS  °The details of how you feel help guide your caregiver in finding out what is causing the fatigue. You will be asked about your present and past health condition. It is important to review all medicines that you take, including prescription and non-prescription items. A thorough exam will be done. You will be questioned about your feelings, habits, and normal lifestyle. Your caregiver may suggest blood tests, urine tests, or other tests to look for common medical causes of fatigue.  °TREATMENT  °Fatigue is treated by correcting the underlying cause. For example, if you have continuous pain or depression, treating these causes will improve how you feel. Similarly, adjusting the dose of certain medicines will help in reducing fatigue.  °HOME CARE INSTRUCTIONS  °· Try to get the required amount of good sleep every night. °· Eat a healthy and nutritious diet, and drink enough water throughout the day. °· Practice ways of relaxing (including yoga or meditation). °· Exercise regularly. °· Make plans to change situations that cause stress. Act on those plans so that stresses decrease over time. Keep your work and personal routine reasonable. °· Avoid street drugs and minimize use of alcohol. °· Start taking   a daily multivitamin after consulting your caregiver. °SEEK MEDICAL CARE IF:  °· You have persistent tiredness, which cannot be accounted for. °· You have fever. °· You have unintentional weight loss. °· You have headaches. °· You have disturbed sleep throughout the night. °· You are feeling sad. °· You have constipation. °· You have dry skin. °· You have gained weight. °· You are taking any new or different medicines that you suspect are causing fatigue. °· You are unable to sleep at night. °· You develop any unusual swelling of your  legs or other parts of your body. °SEEK IMMEDIATE MEDICAL CARE IF:  °· You are feeling confused. °· Your vision is blurred. °· You feel faint or pass out. °· You develop severe headache. °· You develop severe abdominal, pelvic, or back pain. °· You develop chest pain, shortness of breath, or an irregular or fast heartbeat. °· You are unable to pass a normal amount of urine. °· You develop abnormal bleeding such as bleeding from the rectum or you vomit blood. °· You have thoughts about harming yourself or committing suicide. °· You are worried that you might harm someone else. °MAKE SURE YOU:  °· Understand these instructions. °· Will watch your condition. °· Will get help right away if you are not doing well or get worse. °Document Released: 03/03/2007 Document Revised: 07/29/2011 Document Reviewed: 09/07/2013 °ExitCare® Patient Information ©2015 [employer]. This information is not intended to replace advice given to you by your health care provider. Make sure you discuss any questions you have with your health care provider. ° °

## 2014-10-13 NOTE — ED Notes (Signed)
Patient states that on yesterday she had a pain in her right neck that radiated down to her shoulder for 30 seconds and then go away. This occurred on and off all day. She then had a stomach ache and nausea after. Patient has history of celiac disease and thought maybe this was the case, but today she "feels weird". She states today that she feel "tightness" in her chest. She denies pain, but that just tightness and shortness of breath at rest. She has has significant family cardiac history.

## 2014-10-13 NOTE — ED Provider Notes (Signed)
CSN: [id number]     Arrival date & time 10/13/14  0705 History   First MD Initiated Contact with Patient 10/13/14 [phone]     Chief Complaint  Patient presents with  . Fatigue  . Shortness of Breath      The history is provided by the patient, medical records and a relative.   patient reports intermittent transient sharp discomfort in her right chest yesterday that would radiate to her right neck.  She had no significant shortness of breath with this yesterday.  Today she also has some ongoing chest tightness and shortness of breath.  She states she has a strong family history of heart disease and was concerned about the possibility of cardiac disease.  She denies a history of hypertension, hyperlipidemia, diabetes.  She does not smoke cigarettes.  She does struggle with celiac disease and yesterday had mild nausea and some upper abdominal discomfort which is consistent with her celiac disease when it flares.  She denies nausea and upper abdominal pain at this time.  She has no active chest discomfort or symptoms at this time.  She states she was looking on the Internet last night about her symptoms and this gave her some concern which is what brought her to the emergency department today.  She is somewhat sedentary and is not very active.  She states she's had some weight gain over the past year.  She reports new exertional shortness of breath over the past 6 months such as when she drinks her garbage can up her driveway.  She denies shortness of breath at this time.  No history of lung disease.  No fevers or chills.  No productive cough.  No leg swelling.  No history DVT or pulmonary embolism.  She has not seen her primary care physician in several years.  She states she does give plasma twice a week and states that they check her blood counts regularly and she's never been told she is anemic.  She reports maybe her skin is slightly dry but there's been no change to her quality.  She's never had thyroid  issues before.  She does continue to have menstrual cycles.  At time she has heavy vaginal bleeding but this is not uncommon for her.  She states this is consistent with her typical cycles.  She does tend to get a lot of low back pain associated with her menstrual cycles.    Past Medical History  Diagnosis Date  . DEPRESSION 08/31/2008    Qualifier: Diagnosis of  By: Gabriel RungNeckers LPN, Harriett SineNancy    . MIGRAINE HEADACHE 10/13/2008    Qualifier: Diagnosis of  By: Everett Graffodd, RN, Ellen    . Celiac disease 10/13/2008    Qualifier: Diagnosis of  By: Everett Graffodd, RN, Ellen    . SYNCOPE 03/29/2010    Qualifier: Diagnosis of  By: Caryl NeverBurchette MD, Bruce    . Celiac disease    Past Surgical History  Procedure Laterality Date  . Cesarean section    . Cholecystectomy     Family History  Problem Relation Age of Onset  . Hypertension Other   . Heart disease Other   . Cancer Other     grandmother, breast   History  Substance Use Topics  . Smoking status: Never Smoker   . Smokeless tobacco: Not on file  . Alcohol Use: No   OB History    No data available     Review of Systems  All other systems reviewed and are negative.  Allergies  Hydrocodone-acetaminophen  Home Medications   Prior to Admission medications   Medication Sig Start Date End Date Taking? Authorizing Provider  cetirizine (ZYRTEC) 10 MG tablet Take 10 mg by mouth daily.   Yes Historical Provider, MD  diclofenac sodium (VOLTAREN) 1 % GEL Apply 4 g topically 4 (four) times daily. Patient taking differently: Apply 4 g topically once as needed (elbow pain).  01/07/12  Yes Kristian Covey, MD  pantoprazole (PROTONIX) 40 MG tablet Take 1 tablet (40 mg total) by mouth daily. 09/17/11 09/16/12  Kristian Covey, MD   BP 111/81 mmHg  Pulse 81  Temp(Src) 97.8 F (36.6 C) (Oral)  Resp 19  SpO2 100%  LMP 10/13/2014 Physical Exam  Constitutional: She is oriented to person, place, and time. She appears well-developed and well-nourished. No  distress.  HENT:  Head: Normocephalic and atraumatic.  Eyes: EOM are normal.  Neck: Normal range of motion.  Cardiovascular: Normal rate, regular rhythm and normal heart sounds.   Pulmonary/Chest: Effort normal and breath sounds normal.  Abdominal: Soft. She exhibits no distension. There is no tenderness.  Musculoskeletal: Normal range of motion.  Neurological: She is alert and oriented to person, place, and time.  Skin: Skin is warm and dry.  Psychiatric:  Anxious appearing  Nursing note and vitals reviewed.   ED Course  Procedures (including critical care time) Labs Review Labs Reviewed  COMPREHENSIVE METABOLIC PANEL - Abnormal; Notable for the following:    CO2 21 (*)    Glucose, Bld 107 (*)    Calcium 8.7 (*)    All other components within normal limits  CBC WITH DIFFERENTIAL/PLATELET  TROPONIN I  TSH  D-DIMER, QUANTITATIVE (NOT AT [facility])    Imaging Review Dg Chest 2 View  10/13/2014   CLINICAL DATA:  Shortness of Breath  EXAM: CHEST  2 VIEW  COMPARISON:  None.  FINDINGS: The heart size and mediastinal contours are within normal limits. Both lungs are clear. The visualized skeletal structures are unremarkable.  IMPRESSION: No active cardiopulmonary disease.   Electronically Signed   By: Natasha Mead M.D.   On: 10/13/2014 09:00     EKG Interpretation   Date/Time:  Thursday Oct 13 2014 07:32:43 EDT Ventricular Rate:  76 PR Interval:  139 QRS Duration: 73 QT Interval:  372 QTC Calculation: 418 R Axis:   81 Text Interpretation:  Sinus rhythm Minimal ST depression, inferior leads  No significant change was found Confirmed by Nerine Pulse  MD, Amias Hutchinson ([id number]) on  10/13/2014 8:02:00 AM      MDM   Final diagnoses:  Chest pain, unspecified chest pain type  Other fatigue    Low suspicion for cardiac disease.  EKG is unchanged from prior EKG.  No active chest pain at this time.  She does seem rather anxious at this time.  But her symptoms may be related to gastroesophageal  reflux disease given the sharp nature of her discomfort and pain that she had yesterday.  She'll likely need ongoing primary care follow-up for her generalized fatigue.  Thyroid studies were ordered but these will likely need to be followed up by her primary care physician.  Chest x-ray, labs, d-dimer pending.  9:56 AM Patient continues to be Ace and back.  Primary care follow-up.  Vitals are normal.  Labs without significant abnormality.  D-dimer is normal.   Azalia Bilis, MD 10/13/14 [phone]

## 2014-10-20 ENCOUNTER — Ambulatory Visit (INDEPENDENT_AMBULATORY_CARE_PROVIDER_SITE_OTHER): Payer: BLUE CROSS/BLUE SHIELD | Admitting: Family Medicine

## 2014-10-20 ENCOUNTER — Encounter: Payer: Self-pay | Admitting: Family Medicine

## 2014-10-20 VITALS — BP 110/70 | HR 66 | Temp 98.2°F | Wt 160.0 lb

## 2014-10-20 DIAGNOSIS — R0789 Other chest pain: Secondary | ICD-10-CM | POA: Diagnosis not present

## 2014-10-20 DIAGNOSIS — R5382 Chronic fatigue, unspecified: Secondary | ICD-10-CM

## 2014-10-20 LAB — VITAMIN D 25 HYDROXY (VIT D DEFICIENCY, FRACTURES): VITD: 15.81 ng/mL — AB (ref 30.00–100.00)

## 2014-10-20 NOTE — Progress Notes (Signed)
Subjective:    Patient ID: Amy CascoAmy A Kaufman, female    DOB: [birth year], 44 y.o.   MRN: [id number]  HPI  Patient here for ER follow-up. She was evaluated on 10/13/2014. She presented with transient sharp right-sided chest pain radiating toward her right neck. She did not have any associated shortness of breath. She has family history of several people with valvular heart issues and father with atrial fibrillation. No premature coronary artery disease history. She was quite anxious because of this. She has noticed some occasional exertional shortness of breath past 6 months but has not been as active with exercise. No history of DVT or pulmonary embolus. ER evaluation unremarkable. Chest x-ray unremarkable. EKG was no acute changes. She had several labs including d-dimer, TSH, chemistries, CBC, troponin all normal.  Patient has background of chronic fatigue which is gone for several years. She doesn't she generally sleeps okay. She has lots of stress with working full job and also part-time job. Within several hours per day. Denies any current depression issues. No recent dietary changes. Takes no regular medications. Does have history of celiac disease which is been stable with gluten avoidance. She has had some steady weight gain in recent months. She states that her muscles frequently feel fatigued.  Past Medical History  Diagnosis Date  . DEPRESSION 08/31/2008    Qualifier: Diagnosis of  By: Gabriel RungNeckers LPN, Harriett SineNancy    . MIGRAINE HEADACHE 10/13/2008    Qualifier: Diagnosis of  By: Everett Graffodd, RN, Ellen    . Celiac disease 10/13/2008    Qualifier: Diagnosis of  By: Everett Graffodd, RN, Ellen    . SYNCOPE 03/29/2010    Qualifier: Diagnosis of  By: Caryl NeverBurchette MD, Bruce    . Celiac disease    Past Surgical History  Procedure Laterality Date  . Cesarean section    . Cholecystectomy      reports that she has never smoked. She does not have any smokeless tobacco history on file. She reports that she does not drink alcohol  or use illicit drugs. family history includes Cancer in her other; Heart disease in her other; Hypertension in her other. Allergies  Allergen Reactions  . Hydrocodone-Acetaminophen     iitching     Review of Systems  Constitutional: Positive for fatigue. Negative for appetite change.  Respiratory: Negative for cough and shortness of breath.   Cardiovascular: Negative for palpitations and leg swelling.  Gastrointestinal: Negative for nausea, vomiting, abdominal pain and diarrhea.  Genitourinary: Negative for dysuria.  Neurological: Negative for dizziness and weakness.  Hematological: Negative for adenopathy.  Psychiatric/Behavioral: Negative for dysphoric mood.       Objective:   Physical Exam  Constitutional: She is oriented to person, place, and time. She appears well-developed and well-nourished.  Neck: Neck supple. No thyromegaly present.  Cardiovascular: Normal rate and regular rhythm.  Exam reveals no gallop.   No murmur heard. Pulmonary/Chest: Effort normal and breath sounds normal. No respiratory distress. She has no wheezes. She has no rales.  Musculoskeletal: She exhibits no edema.  Neurological: She is alert and oriented to person, place, and time. She has normal reflexes. No cranial nerve deficit.  Psychiatric: She has a normal mood and affect. Her behavior is normal.          Assessment & Plan:  #1 recent atypical chest pain. ER evaluation unremarkable. Those symptoms have resolved. Observation. #2 chronic fatigue. Multiple recent labs per ER unremarkable. We strongly advise establishing more consistent exercise program. Will check vitamin D  level with her easy muscle fatigability. She appears to be getting adequate sleep

## 2014-10-20 NOTE — Patient Instructions (Signed)

## 2014-10-20 NOTE — Progress Notes (Signed)
Pre visit review using our clinic review tool, if applicable. No additional management support is needed unless otherwise documented below in the visit note. 

## 2014-10-21 ENCOUNTER — Other Ambulatory Visit: Payer: Self-pay | Admitting: Family Medicine

## 2014-10-21 ENCOUNTER — Telehealth: Payer: Self-pay | Admitting: Family Medicine

## 2014-10-21 DIAGNOSIS — E559 Vitamin D deficiency, unspecified: Secondary | ICD-10-CM

## 2014-10-21 MED ORDER — VITAMIN D (ERGOCALCIFEROL) 1.25 MG (50000 UNIT) PO CAPS: 50000.0000 [IU] | ORAL_CAPSULE | ORAL | Status: DC

## 2014-10-21 NOTE — Telephone Encounter (Signed)
Patient called to check the status of the rx Dr. Caryl NeverBurchette was going to send in for her vitamin d deficiency.  I didn't see anything sent in.  Patient would like a callback.  [id number] [place] [facility] [place] - [id number] [phone] (Phone) [phone] (Fax)

## 2014-10-21 NOTE — Telephone Encounter (Signed)
Rx sent to pharmacy   

## 2015-01-18 ENCOUNTER — Other Ambulatory Visit (INDEPENDENT_AMBULATORY_CARE_PROVIDER_SITE_OTHER): Payer: BLUE CROSS/BLUE SHIELD

## 2015-01-18 ENCOUNTER — Other Ambulatory Visit: Payer: BLUE CROSS/BLUE SHIELD

## 2015-01-18 DIAGNOSIS — E559 Vitamin D deficiency, unspecified: Secondary | ICD-10-CM | POA: Diagnosis not present

## 2015-01-18 LAB — VITAMIN D 25 HYDROXY (VIT D DEFICIENCY, FRACTURES): VITD: 43.12 ng/mL (ref 30.00–100.00)

## 2015-04-08 ENCOUNTER — Other Ambulatory Visit: Payer: Self-pay | Admitting: Family Medicine

## 2016-04-19 IMAGING — CR DG CHEST 2V
2 series · 2 of 2 positions shown · non-contrast
Comparison: None.

CLINICAL DATA: Shortness of Breath

EXAM:
CHEST  2 VIEW

[w chest pa]
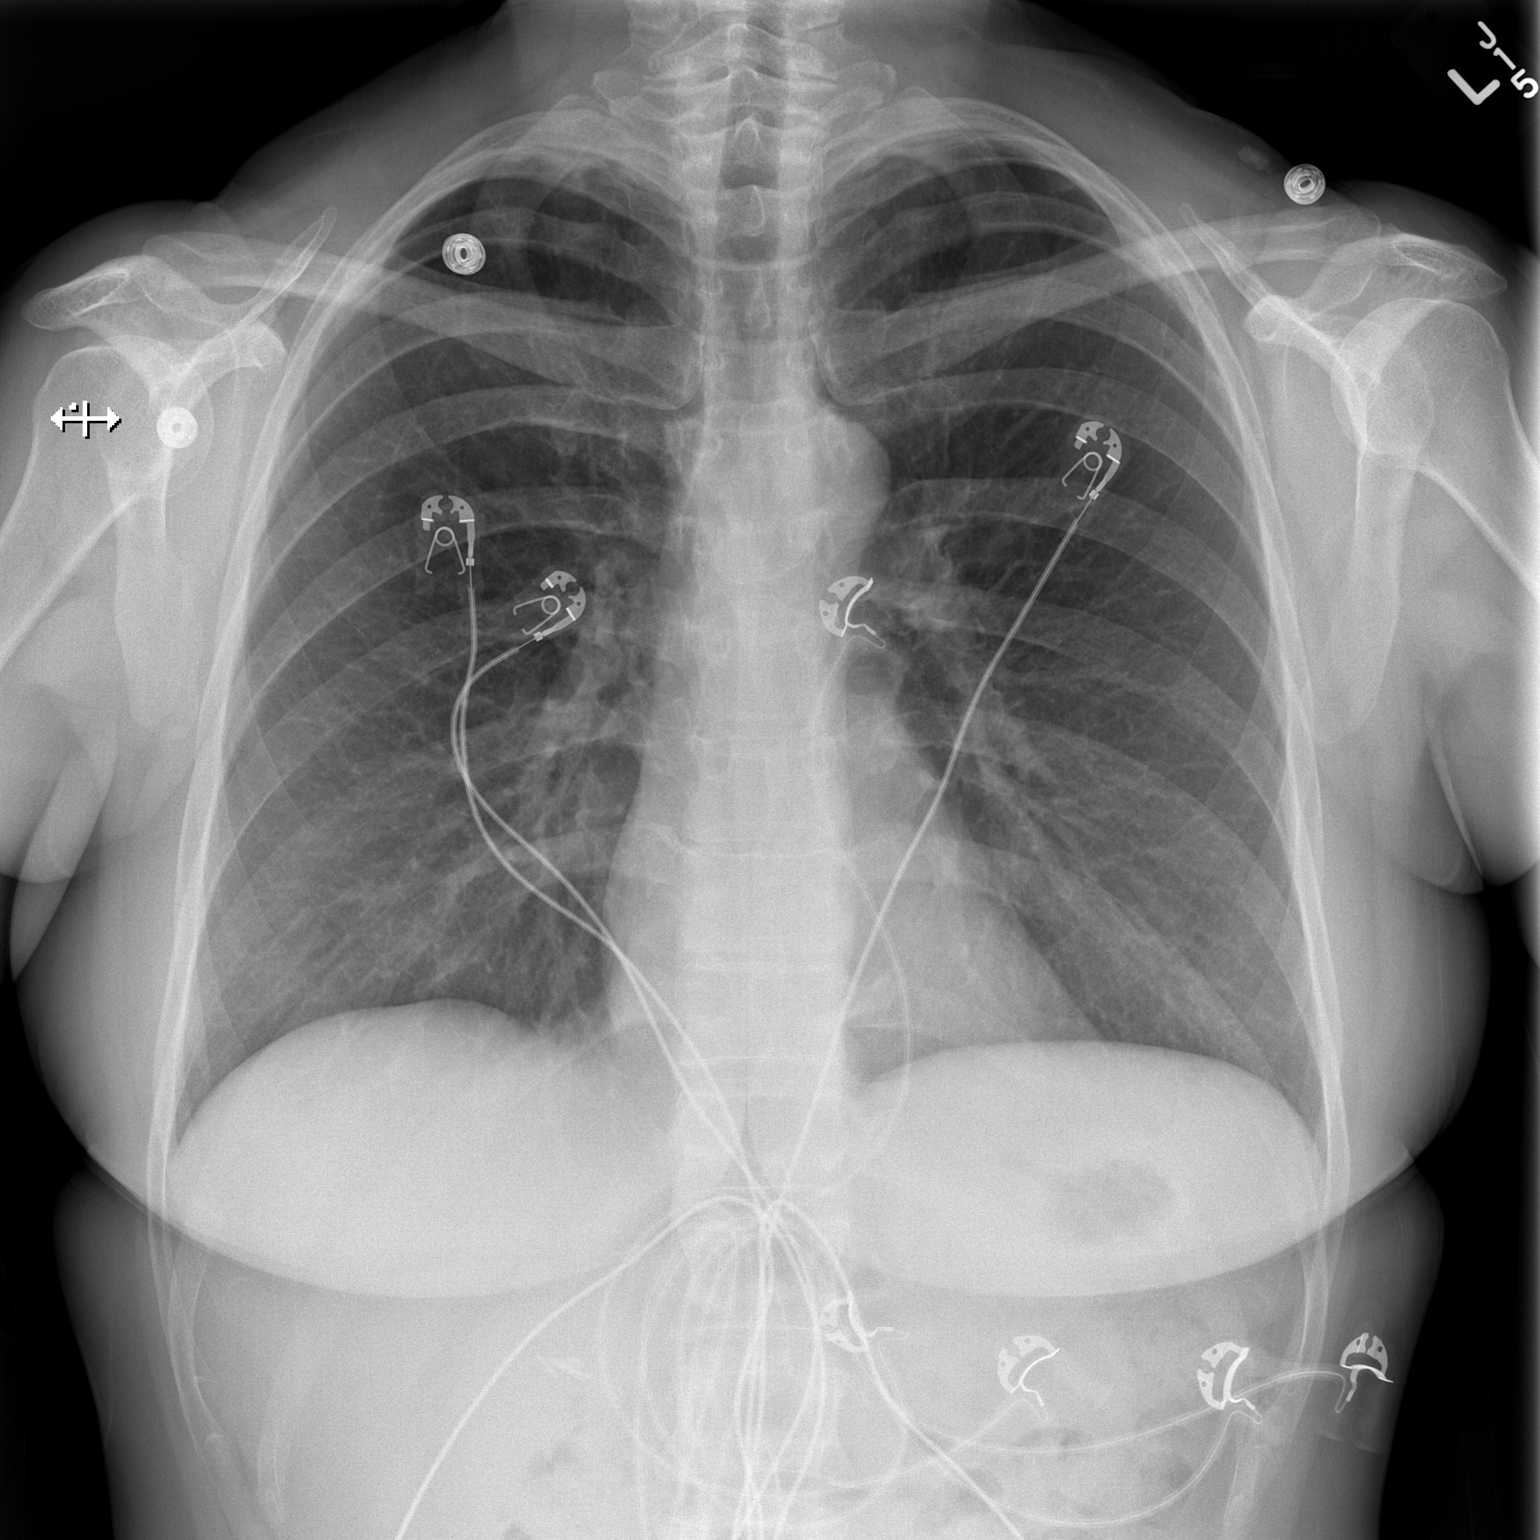

[w chest lat]
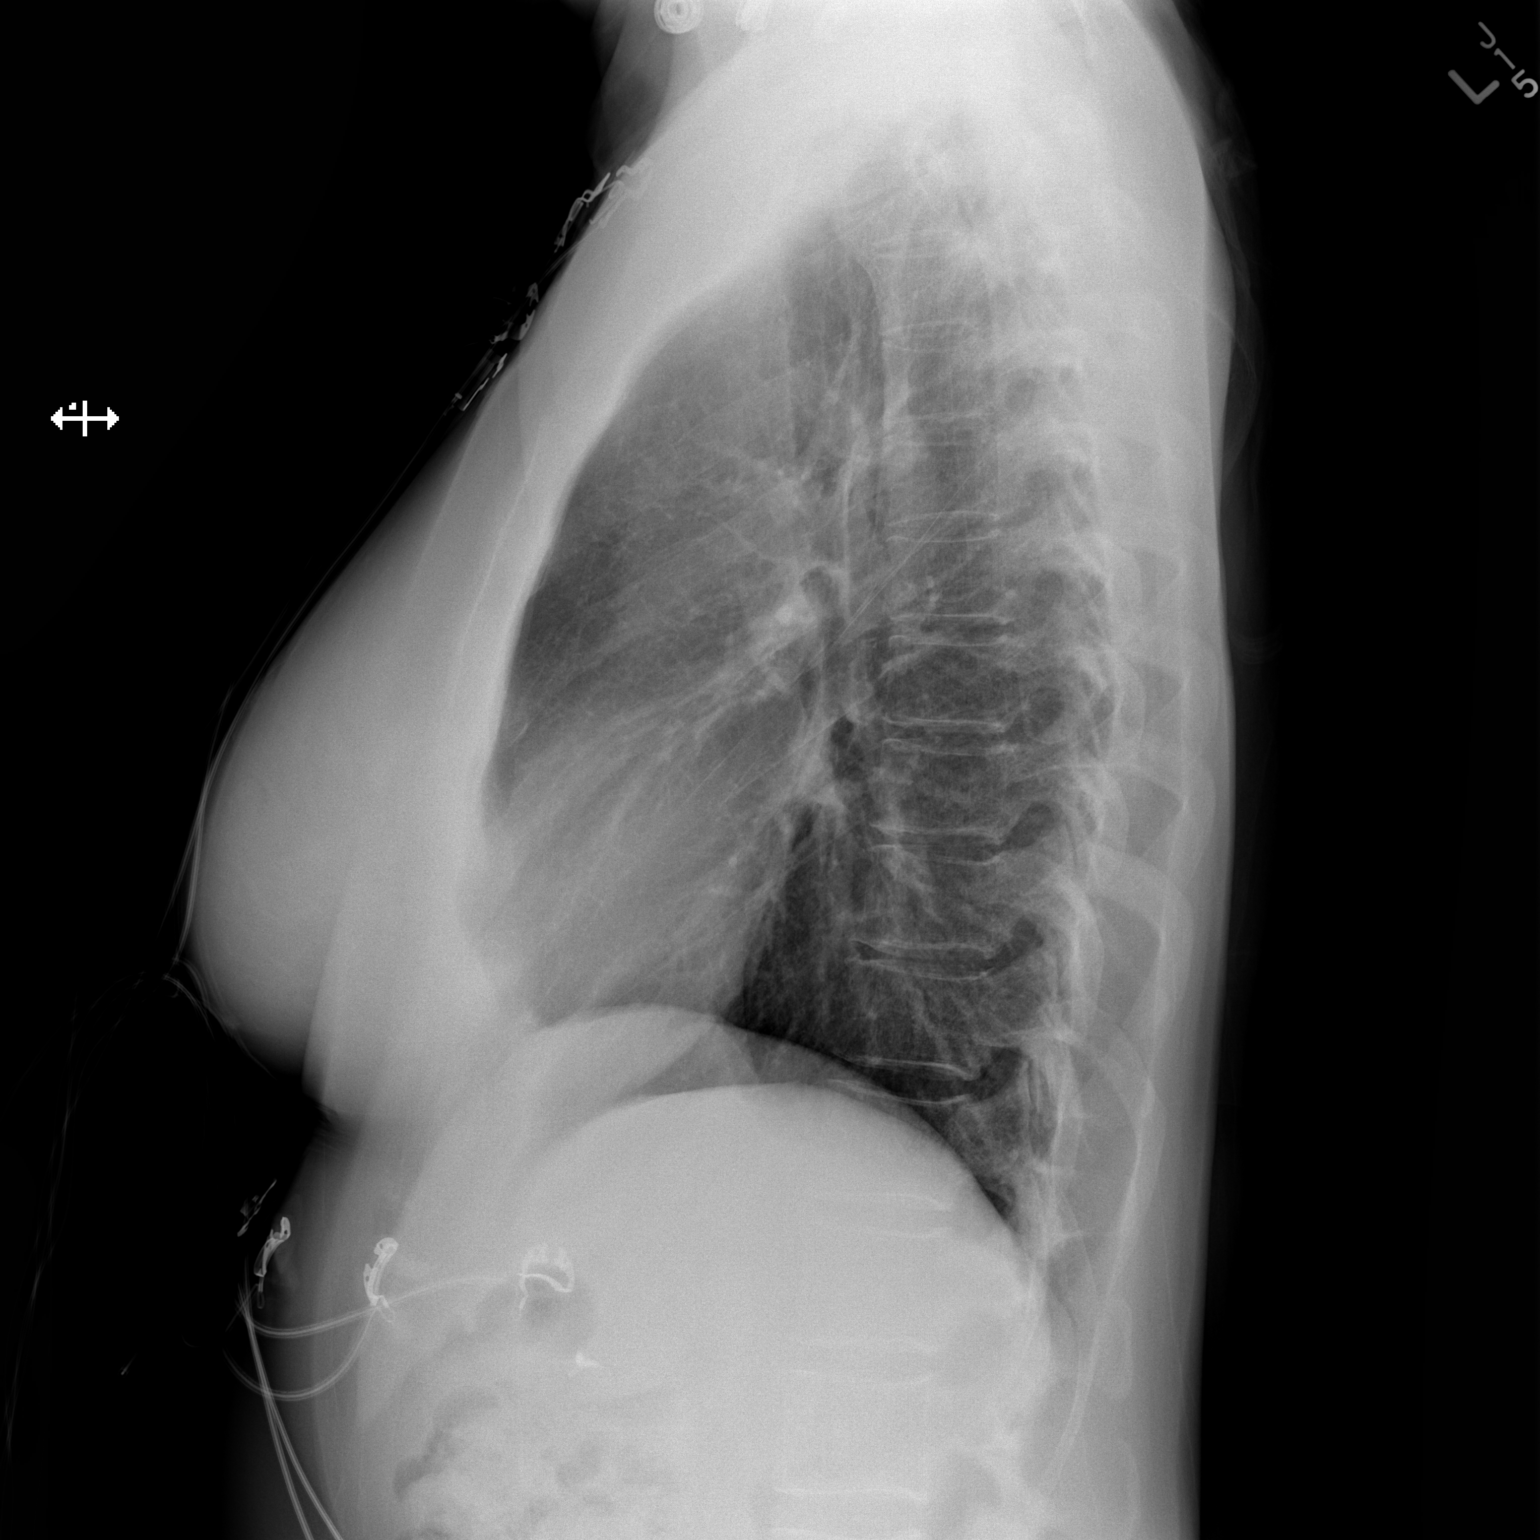

[2 of 2 positions shown; findings below may reference images not displayed]

FINDINGS: The heart size and mediastinal contours are within normal limits.
Both lungs are clear. The visualized skeletal structures are
unremarkable.
IMPRESSION: No active cardiopulmonary disease.

## 2016-07-12 ENCOUNTER — Other Ambulatory Visit: Payer: Self-pay | Admitting: Obstetrics and Gynecology

## 2016-07-12 LAB — HM MAMMOGRAPHY: HM Mammogram: NORMAL (ref 0–4)

## 2016-07-12 LAB — HM PAP SMEAR: HM Pap smear: NORMAL

## 2016-07-15 ENCOUNTER — Encounter: Payer: Self-pay | Admitting: Family Medicine

## 2016-07-16 LAB — CYTOLOGY - PAP

## 2016-08-06 ENCOUNTER — Ambulatory Visit (INDEPENDENT_AMBULATORY_CARE_PROVIDER_SITE_OTHER): Payer: BLUE CROSS/BLUE SHIELD | Admitting: Family Medicine

## 2016-08-06 ENCOUNTER — Encounter: Payer: Self-pay | Admitting: Family Medicine

## 2016-08-06 VITALS — BP 102/70 | HR 66 | Temp 97.8°F | Wt 146.8 lb

## 2016-08-06 DIAGNOSIS — M545 Low back pain, unspecified: Secondary | ICD-10-CM

## 2016-08-06 DIAGNOSIS — R300 Dysuria: Secondary | ICD-10-CM

## 2016-08-06 LAB — POCT URINALYSIS DIPSTICK
Bilirubin, UA: NEGATIVE
GLUCOSE UA: NEGATIVE
Ketones, UA: NEGATIVE
Leukocytes, UA: NEGATIVE
Nitrite, UA: NEGATIVE
PH UA: 6 (ref 5.0–8.0)
Protein, UA: NEGATIVE
RBC UA: NEGATIVE
SPEC GRAV UA: 1.005 (ref 1.030–1.035)
UROBILINOGEN UA: 0.2 (ref ?–2.0)

## 2016-08-06 NOTE — Progress Notes (Signed)
Pre visit review using our clinic review tool, if applicable. No additional management support is needed unless otherwise documented below in the visit note. 

## 2016-08-06 NOTE — Patient Instructions (Signed)
Follow up for any persistent or worsening back pain.

## 2016-08-06 NOTE — Progress Notes (Signed)
Subjective:     Patient ID: Amy Kaufman, female   DOB: 1971/03/27, 46 y.o.   MRN: [id number]  HPI  Patient seen with recent dysuria. She went to gynecologist and states that she had some blood on her urine dipstick but urine culture was negative. She's never seen gross hematuria. She has some urine frequency and burning with urination. He had urine culture which was negative she was placed on Macrobid and took this for 3 days and urinary symptoms are slowly improving.   She's had some dull pain lower back which is not triggered by movement. No radiculopathy symptoms. No lower extremity numbness or weakness. No abdominal pain. No fevers or chills. No flank pain. No recent appetite or weight changes. No night sweats.  Past Medical History:  Diagnosis Date  . Celiac disease 10/13/2008   Qualifier: Diagnosis of  By: Everett Graffodd, RN, Ellen    . Celiac disease   . DEPRESSION 08/31/2008   Qualifier: Diagnosis of  By: Gabriel RungNeckers LPN, Harriett SineNancy    . MIGRAINE HEADACHE 10/13/2008   Qualifier: Diagnosis of  By: Everett Graffodd, RN, Ellen    . SYNCOPE 03/29/2010   Qualifier: Diagnosis of  By: Caryl NeverBurchette MD, Bruce     Past Surgical History:  Procedure Laterality Date  . CESAREAN SECTION    . CHOLECYSTECTOMY      reports that she has never smoked. She has never used smokeless tobacco. She reports that she does not drink alcohol or use drugs. family history includes Cancer in her other; Heart disease in her other; Hypertension in her other. Allergies  Allergen Reactions  . Hydrocodone-Acetaminophen     iitching    Review of Systems  Constitutional: Negative for chills and fever.  Musculoskeletal: Positive for back pain.       Objective:   Physical Exam  Constitutional: She appears well-developed and well-nourished.  Cardiovascular: Normal rate and regular rhythm.   Abdominal: Soft. She exhibits no mass. There is no tenderness. There is no rebound and no guarding.  Musculoskeletal:  Straight leg raises are negative  bilaterally  Neurological:  Symmetric reflexes lower extremities with full strength throughout. She has good back flexion and extension without pain       Assessment:     #1 recent dysuria which is improving following treatment with Macrobid.  Urine dip today completely normal.  #2 low back pain. Possibly related to #1. This appears to be improving    Plan:     -Reassurance and follow-up for any recurrent urinary symptoms or persistent back pain  Kristian CoveyBruce W Burchette MD [facility]

## 2017-02-06 ENCOUNTER — Encounter: Payer: Self-pay | Admitting: Family Medicine

## 2018-08-06 ENCOUNTER — Ambulatory Visit: Payer: Self-pay

## 2018-08-06 NOTE — Telephone Encounter (Signed)
Please advise if any further recommendations.

## 2018-08-06 NOTE — Telephone Encounter (Signed)
Pt called stating that she has had flu symptoms since last Friday.. She states her symptoms came on quickly. She developed fever over 100 during the weekend.  She has a cough and expectorates green mucus. Her temperature today is 98.9. She has a runny nose and her sternum is sore. She feels tired. She did not receive the flu vaccine this fall. She has a Hx of Cialic disease. Per protocol pt will treat at home.  Home care advice read to patient.  Pt verbalized understanding of all instructions.   Reason for Disposition . [1] Probable influenza (fever) with no complications AND [2] NOT HIGH RISK  Answer Assessment - Initial Assessment Questions 1. WORST SYMPTOM: "What is your worst symptom?" (e.g., cough, runny nose, muscle aches, headache, sore throat, fever)      cough 2. ONSET: "When did your flu symptoms start?"      1 week ago 3. COUGH: "How bad is the cough?"       frequent cough 4. RESPIRATORY DISTRESS: "Describe your breathing."     No problem tender in steren 5. FEVER: "Do you have a fever?" If so, ask: "What is your temperature, how was it measured, and when did it start?"    98.9 today higher last Saturday 100 or greater 6. EXPOSURE: "Were you exposed to someone with influenza?"      Going around work 7. FLU VACCINE: "Did you get a flu shot this year?"     no 8. HIGH RISK DISEASE: "Do you any chronic medical problems?" (e.g., heart or lung disease, asthma, weak immune system, or other HIGH RISK conditions)     cialic dx 9. PREGNANCY: "Is there any chance you are pregnant?" "When was your last menstrual period?"    No 10. OTHER SYMPTOMS: "Do you have any other symptoms?"  (e.g., runny nose, muscle aches, headache, sore throat)      Headache, weakness, pain to sternum, runny nose  Protocols used: INFLUENZA - SEASONAL-A-AH

## 2018-08-07 ENCOUNTER — Telehealth: Payer: Self-pay | Admitting: Family Medicine

## 2018-08-07 ENCOUNTER — Encounter: Payer: Self-pay | Admitting: Family Medicine

## 2018-08-07 ENCOUNTER — Other Ambulatory Visit: Payer: Self-pay

## 2018-08-07 ENCOUNTER — Ambulatory Visit (INDEPENDENT_AMBULATORY_CARE_PROVIDER_SITE_OTHER): Payer: 59 | Admitting: Family Medicine

## 2018-08-07 DIAGNOSIS — R05 Cough: Secondary | ICD-10-CM

## 2018-08-07 DIAGNOSIS — J302 Other seasonal allergic rhinitis: Secondary | ICD-10-CM

## 2018-08-07 DIAGNOSIS — R059 Cough, unspecified: Secondary | ICD-10-CM

## 2018-08-07 NOTE — Telephone Encounter (Signed)
Author phoned pt this AM, and advised to be seen in covid clinic per Dr. Lucie Leather request. Appointment made.

## 2018-08-07 NOTE — Progress Notes (Signed)
Questions for Screening COVID-19  Symptom onset: 1+ week ago; starting with nasal congestion, then progressed to cough. Had fever Friday, sat, Sunday, Monday. Feels better today. Cough has improved. Does not feel short of breath.   Travel or Contacts: none known.   During this illness, did/does the patient experience any of the following symptoms? Fever >100.35F [x]   Yes []   No []   Unknown Subjective fever (felt feverish) [x]   Yes []   No []   Unknown Chills [x]   Yes []   No []   Unknown Muscle aches (myalgia) [x]   Yes []   No []   Unknown Runny nose (rhinorrhea) [x]   Yes []   No []   Unknown Sore throat [x]   Yes []   No []   Unknown Cough (new onset or worsening of chronic cough) [x]   Yes []   No []   Unknown Shortness of breath (dyspnea) []   Yes [x]   No []   Unknown Nausea or vomiting []   Yes [x]   No []   Unknown Headache [x]   Yes []   No []   Unknown Abdominal pain  []   Yes []   No []   Unknown Diarrhea (?3 loose/looser than normal stools/24hr period) []   Yes [x]   No []   Unknown Other, specify:_____________________________________________   Patient risk factors: Smoker? []   Current []   Former [x]   Never If female, currently pregnant? []   Yes []   No  Patient Active Problem List   Diagnosis Date Noted  . SYNCOPE 03/29/2010  . MIGRAINE HEADACHE 10/13/2008  . CELIAC DISEASE 10/13/2008  . DEPRESSION 08/31/2008    Plan:  []   High risk for COVID-19 with red flags go to ED (with CP, SOB, weak/lightheaded, or fever > 101.5). Call ahead.  []   High risk for COVID-19 but stable will have car visit. Inform provider and coordinate time. Will be completed in afternoon. [x]   No red flags but URI signs or symptoms will go through side door and be seen in dedicated room.  Note: Referral to telemedicine is an appropriate alternative disposition for higher risk but stable. [facility] Tele[facility]/e-Visit: [phone].   Amy Kaufman DOB: 12/06/1970 Encounter date: 08/07/2018  This is a 48 y.o. female who  presents with No chief complaint on file.   History of present illness:  HPI   Allergies  Allergen Reactions  . Hydrocodone-Acetaminophen     iitching   No outpatient medications have been marked as taking for the 08/07/18 encounter (Office Visit) with Wynn Banker, MD.    Review of Systems  Objective:  There were no vitals taken for this visit.      BP Readings from Last 3 Encounters:  08/06/16 102/70  10/20/14 110/70  10/13/14 100/66   Wt Readings from Last 3 Encounters:  08/06/16 146 lb 12.8 oz (66.6 kg)  10/20/14 160 lb (72.6 kg)  01/07/12 150 lb (68 kg)    Physical Exam Constitutional:      General: She is not in acute distress.    Appearance: She is ill-appearing. She is not toxic-appearing.  HENT:     Head: Normocephalic and atraumatic.     Right Ear: Tympanic membrane, ear canal and external ear normal.     Left Ear: Tympanic membrane, ear canal and external ear normal.     Nose: Mucosal edema present.     Right Turbinates: Swollen.     Left Turbinates: Swollen.     Right Sinus: No maxillary sinus tenderness or frontal sinus tenderness.     Left Sinus: No maxillary sinus tenderness or frontal  sinus tenderness.     Mouth/Throat:     Pharynx: Posterior oropharyngeal erythema present. No pharyngeal swelling or oropharyngeal exudate.  Cardiovascular:     Rate and Rhythm: Normal rate and regular rhythm.     Heart sounds: Normal heart sounds.  Pulmonary:     Effort: Pulmonary effort is normal.     Breath sounds: Normal breath sounds and air entry. No decreased air movement. No wheezing or rales.  Neurological:     Mental Status: She is alert.     Assessment/Plan  1. Cough Reassuring that she is improving clinically. Exam is stable. Continue to monitor symptoms. Let us know if any worsening of symptoms.   2. Seasonal allergies She uses homeopathic treatment for these and it is helping. Continue with symptomatic treatment.     We did discuss  limiting exposure to others until 3 days fever free.  Return if symptoms worsen or fail to improve.    Theodis Shove, MD

## 2018-08-07 NOTE — Telephone Encounter (Signed)
Sounds like she should be evaluated in our afternoon clinic, given the current Covid-19 pandemic.

## 2018-08-07 NOTE — Telephone Encounter (Signed)
Questions for Screening COVID-19  Symptom onset: Started 3/11  Fever onset 3/13 highest 101.7 Chills Cough with green mucous Hard to tell IF short of breath, feels more fatigued and weak, worse with exertion. Pain in sternum from coughing  Travel or Contacts:  No travel Friend that recently moved her from [place] x 1 month ago.   No contact.  During this illness, did/does the patient experience any of the following symptoms? Fever >100.35F [x]   Yes []   No []   Unknown 100-101.7 Subjective fever (felt feverish) [x]   Yes []   No []   Unknown Chills [x]   Yes []   No []   Unknown Muscle aches (myalgia) [x]   Yes []   No []   Unknown Runny nose (rhinorrhea) [x]   Yes []   No []   Unknown Sore throat [x]   Yes []   No []   Unknown Cough (new onset or worsening of chronic cough) [x]   Yes []   No []   Unknown Shortness of breath (dyspnea) [x]   Yes []   No []   Unknown  Fatigued weakness -- worse with exertion Nausea or vomiting [x]   Yes []   No []   Unknown  Vomiting from cough spasms Headache [x]   Yes []   No []   Unknown Abdominal pain  []   Yes [x]   No []   Unknown Diarrhea (?3 loose/looser than normal stools/24hr period) []   Yes [x]   No []   Unknown Other, specify:  Patient risk factors: Smoker? []   Current []   Former []   Never If female, currently pregnant? []   Yes []   No  Patient Active Problem List   Diagnosis Date Noted  . SYNCOPE 03/29/2010  . MIGRAINE HEADACHE 10/13/2008  . CELIAC DISEASE 10/13/2008  . DEPRESSION 08/31/2008    Plan:  []   High risk for COVID-19 with red flags go to ED (with CP, SOB, weak/lightheaded, or fever > 101.5). Call ahead.  []   High risk for COVID-19 but stable will have car visit. Inform provider and coordinate time. Will be completed in afternoon. []   No red flags but URI signs or symptoms will go through side door and be seen in dedicated room.  Note: Referral to telemedicine is an appropriate alternative disposition for higher risk but stable. [facility]  Telehealth/e-Visit: [phone].

## 2018-08-18 ENCOUNTER — Encounter: Payer: Self-pay | Admitting: Family Medicine

## 2019-01-06 ENCOUNTER — Ambulatory Visit (INDEPENDENT_AMBULATORY_CARE_PROVIDER_SITE_OTHER): Payer: 59 | Admitting: Family Medicine

## 2019-01-06 ENCOUNTER — Other Ambulatory Visit: Payer: Self-pay

## 2019-01-06 ENCOUNTER — Encounter: Payer: Self-pay | Admitting: Family Medicine

## 2019-01-06 VITALS — BP 102/72 | HR 93 | Temp 98.2°F | Ht 67.5 in | Wt 130.2 lb

## 2019-01-06 DIAGNOSIS — R350 Frequency of micturition: Secondary | ICD-10-CM | POA: Diagnosis not present

## 2019-01-06 DIAGNOSIS — R5383 Other fatigue: Secondary | ICD-10-CM | POA: Diagnosis not present

## 2019-01-06 DIAGNOSIS — R634 Abnormal weight loss: Secondary | ICD-10-CM

## 2019-01-06 DIAGNOSIS — E559 Vitamin D deficiency, unspecified: Secondary | ICD-10-CM

## 2019-01-06 DIAGNOSIS — R0600 Dyspnea, unspecified: Secondary | ICD-10-CM

## 2019-01-06 DIAGNOSIS — R252 Cramp and spasm: Secondary | ICD-10-CM

## 2019-01-06 LAB — POCT URINALYSIS DIPSTICK
Bilirubin, UA: NEGATIVE
Blood, UA: NEGATIVE
Glucose, UA: NEGATIVE
Ketones, UA: NEGATIVE
Leukocytes, UA: NEGATIVE
Nitrite, UA: NEGATIVE
Protein, UA: NEGATIVE
Spec Grav, UA: 1.015 (ref 1.010–1.025)
Urobilinogen, UA: 0.2 E.U./dL
pH, UA: 7.5 (ref 5.0–8.0)

## 2019-01-06 LAB — BASIC METABOLIC PANEL
BUN: 15 mg/dL (ref 6–23)
CO2: 28 mEq/L (ref 19–32)
Calcium: 9.2 mg/dL (ref 8.4–10.5)
Chloride: 104 mEq/L (ref 96–112)
Creatinine, Ser: 0.75 mg/dL (ref 0.40–1.20)
GFR: 81.61 mL/min (ref >60.00)
Glucose, Bld: 83 mg/dL (ref 70–99)
Potassium: 4.4 mEq/L (ref 3.5–5.1)
Sodium: 140 mEq/L (ref 135–145)

## 2019-01-06 LAB — CBC WITH DIFFERENTIAL/PLATELET
Basophils Absolute: 0.1 10*3/uL (ref 0.0–0.1)
Basophils Relative: 0.9 % (ref 0.0–3.0)
Eosinophils Absolute: 0.2 10*3/uL (ref 0.0–0.7)
Eosinophils Relative: 2.7 % (ref 0.0–5.0)
HCT: 42.5 % (ref 36.0–46.0)
Hemoglobin: 14.5 g/dL (ref 12.0–15.0)
Lymphocytes Relative: 30.3 % (ref 12.0–46.0)
Lymphs Abs: 2.1 10*3/uL (ref 0.7–4.0)
MCHC: 34.1 g/dL (ref 30.0–36.0)
MCV: 92.3 fl (ref 78.0–100.0)
Monocytes Absolute: 0.6 10*3/uL (ref 0.1–1.0)
Monocytes Relative: 9.1 % (ref 3.0–12.0)
Neutro Abs: 4 10*3/uL (ref 1.4–7.7)
Neutrophils Relative %: 57 % (ref 43.0–77.0)
Platelets: 271 10*3/uL (ref 150.0–400.0)
RBC: 4.6 Mil/uL (ref 3.87–5.11)
RDW: 12.7 % (ref 11.5–15.5)
WBC: 7.1 10*3/uL (ref 4.0–10.5)

## 2019-01-06 LAB — HEPATIC FUNCTION PANEL
ALT: 14 U/L (ref 0–35)
AST: 15 U/L (ref 0–37)
Albumin: 4.1 g/dL (ref 3.5–5.2)
Alkaline Phosphatase: 53 U/L (ref 39–117)
Bilirubin, Direct: 0.1 mg/dL (ref 0.0–0.3)
Total Bilirubin: 0.4 mg/dL (ref 0.2–1.2)
Total Protein: 5.9 g/dL — ABNORMAL LOW (ref 6.0–8.3)

## 2019-01-06 LAB — MAGNESIUM: Magnesium: 2 mg/dL (ref 1.5–2.5)

## 2019-01-06 LAB — TSH: TSH: 0.97 u[IU]/mL (ref 0.35–4.50)

## 2019-01-06 LAB — T4, FREE: Free T4: 0.91 ng/dL (ref 0.60–1.60)

## 2019-01-06 LAB — VITAMIN D 25 HYDROXY (VIT D DEFICIENCY, FRACTURES): VITD: 23.95 ng/mL — ABNORMAL LOW (ref 30.00–100.00)

## 2019-01-06 NOTE — Progress Notes (Signed)
Subjective:     Patient ID: Amy Kaufman, female   DOB: 09/18/1970, 48 y.o.   MRN: [id number]  HPI Patient is seen with fatigue issues.  She has history of celiac disease and she is generally good avoiding glutens.  She has multiple other symptoms including generalized weakness, achy muscles, increased hunger but some weight loss of about 15 pounds since she was here 2 years ago.  She has frequent nocturnal leg cramps which is a chronic issue.  She has taken some type of homeopathic remedy in the past which worked but ran out.  She feels poor quality of sleep at night may be impacting her fatigue levels.  She has some daytime somnolence.  No known history of apnea.  Previous vitamin D deficiency and requesting recheck.  Denies any recent fever, chest pain, abdominal pain, or any change in stool habits.  She has had some urine frequency but no burning.  Past Medical History:  Diagnosis Date  . Celiac disease 10/13/2008   Qualifier: Diagnosis of  By: Amy Graffodd, RN, Amy Kaufman    . Celiac disease   . DEPRESSION 08/31/2008   Qualifier: Diagnosis of  By: Amy RungNeckers LPN, Amy Kaufman    . MIGRAINE HEADACHE 10/13/2008   Qualifier: Diagnosis of  By: Amy Graffodd, RN, Amy Kaufman    . SYNCOPE 03/29/2010   Qualifier: Diagnosis of  By: Amy NeverBurchette MD, Amy Kaufman     Past Surgical History:  Procedure Laterality Date  . CESAREAN SECTION    . CHOLECYSTECTOMY      reports that she has never smoked. She has never used smokeless tobacco. She reports that she does not drink alcohol or use drugs. family history includes Cancer in an other family member; Heart disease in an other family member; Hypertension in an other family member. Allergies  Allergen Reactions  . Hydrocodone-Acetaminophen     iitching     Review of Systems  Constitutional: Positive for fatigue. Negative for chills and fever.  Respiratory: Positive for shortness of breath. Negative for cough, wheezing and stridor.   Cardiovascular: Negative for chest pain, palpitations and  leg swelling.  Gastrointestinal: Negative for abdominal pain, blood in stool, diarrhea, nausea and vomiting.  Genitourinary: Negative for dysuria.  Musculoskeletal: Positive for myalgias.  Skin: Negative for rash.  Neurological: Positive for weakness. Negative for speech difficulty and headaches.  Hematological: Negative for adenopathy. Does not bruise/bleed easily.       Objective:   Physical Exam Constitutional:      Appearance: Normal appearance.  Neck:     Musculoskeletal: Normal range of motion and neck supple.  Cardiovascular:     Rate and Rhythm: Normal rate and regular rhythm.     Heart sounds: No murmur. No gallop.   Pulmonary:     Effort: Pulmonary effort is normal.     Breath sounds: Normal breath sounds.  Abdominal:     General: Abdomen is flat.     Palpations: Abdomen is soft. There is no mass.     Tenderness: There is no abdominal tenderness. There is no guarding or rebound.  Musculoskeletal:     Right lower leg: No edema.     Left lower leg: No edema.  Lymphadenopathy:     Cervical: No cervical adenopathy.  Neurological:     General: No focal deficit present.     Mental Status: She is alert.        Assessment:     Patient presents with multiple symptoms including chronic bilateral nocturnal leg cramps, generalized weakness,  daytime somnolence, myalgias, arthralgias, and some modest weight loss without any corresponding loss of appetite    Plan:     -Recommend start with basic labs with CBC, chemistries, TSH, magnesium, vitamin D level -We discussed other potential work-up for daytime somnolence if all the above normal including possible sleep studies.  She is at very low risk for obstructive apnea.  On the other hand, she has frequent nocturnal leg cramps which she thinks is probably interfering with her REM sleep -Discussed various treatment options for leg cramps and she has tried Motrin most of these including frequent stretching, vitamin B complex and  magnesium supplement  Eulas Post MD [facility]

## 2019-01-11 ENCOUNTER — Encounter: Payer: Self-pay | Admitting: Family Medicine

## 2019-01-12 ENCOUNTER — Other Ambulatory Visit: Payer: Self-pay

## 2019-01-12 DIAGNOSIS — R5383 Other fatigue: Secondary | ICD-10-CM

## 2019-01-12 DIAGNOSIS — E559 Vitamin D deficiency, unspecified: Secondary | ICD-10-CM

## 2019-01-12 MED ORDER — VITAMIN D (ERGOCALCIFEROL) 1.25 MG (50000 UNIT) PO CAPS: 50000.0000 [IU] | ORAL_CAPSULE | ORAL | 1 refills | Status: AC

## 2019-02-05 ENCOUNTER — Encounter: Payer: Self-pay | Admitting: Family Medicine

## 2019-02-08 ENCOUNTER — Telehealth: Payer: Self-pay | Admitting: Family Medicine

## 2019-02-08 DIAGNOSIS — M2550 Pain in unspecified joint: Secondary | ICD-10-CM

## 2019-02-08 NOTE — Telephone Encounter (Signed)
Pt still feeling poorly with fatigue, increased sleep, and joint pain.  She has noted a few small ticks this summer.  No rash.   Will order Lyme antibodies and some autoimmune markers.

## 2019-02-09 ENCOUNTER — Other Ambulatory Visit: Payer: Self-pay

## 2019-02-09 ENCOUNTER — Other Ambulatory Visit (INDEPENDENT_AMBULATORY_CARE_PROVIDER_SITE_OTHER): Payer: 59

## 2019-02-09 DIAGNOSIS — E559 Vitamin D deficiency, unspecified: Secondary | ICD-10-CM

## 2019-02-09 DIAGNOSIS — R5383 Other fatigue: Secondary | ICD-10-CM

## 2019-02-09 DIAGNOSIS — M2550 Pain in unspecified joint: Secondary | ICD-10-CM

## 2019-02-09 LAB — VITAMIN D 25 HYDROXY (VIT D DEFICIENCY, FRACTURES): VITD: 47.09 ng/mL (ref 30.00–100.00)

## 2019-02-09 LAB — SEDIMENTATION RATE: Sed Rate: 1 mm/hr (ref 0–30)

## 2019-02-10 ENCOUNTER — Encounter: Payer: Self-pay | Admitting: Family Medicine

## 2019-02-10 LAB — CYCLIC CITRUL PEPTIDE ANTIBODY, IGG: Cyclic Citrullin Peptide Ab: <16 UNITS

## 2019-02-10 LAB — B. BURGDORFI ANTIBODIES: B burgdorferi Ab IgG+IgM: <0.9 index

## 2019-02-10 LAB — ANA: Anti Nuclear Antibody (ANA): NEGATIVE

## 2019-02-10 LAB — RHEUMATOID FACTOR: Rheumatoid fact SerPl-aCnc: <14 IU/mL (ref <14)

## 2019-04-14 ENCOUNTER — Ambulatory Visit: Payer: 59 | Admitting: Family Medicine

## 2019-04-19 ENCOUNTER — Other Ambulatory Visit: Payer: 59

## 2019-04-19 ENCOUNTER — Ambulatory Visit: Payer: 59 | Admitting: Family Medicine

## 2019-04-19 ENCOUNTER — Other Ambulatory Visit: Payer: Self-pay

## 2019-04-21 ENCOUNTER — Other Ambulatory Visit: Payer: Self-pay

## 2019-04-21 ENCOUNTER — Encounter: Payer: Self-pay | Admitting: Family Medicine

## 2019-04-21 ENCOUNTER — Ambulatory Visit (INDEPENDENT_AMBULATORY_CARE_PROVIDER_SITE_OTHER): Payer: 59 | Admitting: Family Medicine

## 2019-04-21 VITALS — BP 108/66 | HR 82 | Temp 97.9°F | Ht 67.5 in | Wt 140.4 lb

## 2019-04-21 DIAGNOSIS — E559 Vitamin D deficiency, unspecified: Secondary | ICD-10-CM | POA: Diagnosis not present

## 2019-04-21 DIAGNOSIS — R3915 Urgency of urination: Secondary | ICD-10-CM | POA: Diagnosis not present

## 2019-04-21 DIAGNOSIS — M2550 Pain in unspecified joint: Secondary | ICD-10-CM

## 2019-04-21 DIAGNOSIS — R5383 Other fatigue: Secondary | ICD-10-CM | POA: Diagnosis not present

## 2019-04-21 LAB — VITAMIN D 25 HYDROXY (VIT D DEFICIENCY, FRACTURES): VITD: 48.94 ng/mL (ref 30.00–100.00)

## 2019-04-21 NOTE — Patient Instructions (Signed)

## 2019-04-21 NOTE — Progress Notes (Signed)
Subjective:     Patient ID: Amy Kaufman, female   DOB: 09/20/1970, 48 y.o.   MRN: [id number]  HPI   Amy Kaufman is here to follow-up regarding her diffuse arthralgias and fatigue issues.  We obtained multiple labs including many inflammatory markers and these were all basically unremarkable.  Sed rate was 1.  No evidence for rheumatoid arthritis.  She has arthralgias involving hips, hands, knees.  She works very long hours and very little time for exercise.  She has some diffuse stiffness.  Complaining of arthralgias greater than myalgias.  No history of fibromyalgia.  She does have history of celiac disease.  She has been baking more than usual but is still trying to avoid gluten.  She has gained 10 pounds that she was last seen here.  She has pervasive fatigue but gets up about 4-5 times at night with some urine urgency.  No burning with urination.  Drinks steady fluids throughout the day.  Avoids fluids before bedtime and also minimal caffeine use.  No alcohol use.  No history of diabetes.  She had vitamin D deficiency and we started 50,000 international units once weekly.  Her repeat level just a month later had rebounded significantly.  Past Medical History:  Diagnosis Date  . Celiac disease 10/13/2008   Qualifier: Diagnosis of  By: Everett Graff    . Celiac disease   . DEPRESSION 08/31/2008   Qualifier: Diagnosis of  By: Gabriel Rung LPN, Harriett Sine    . MIGRAINE HEADACHE 10/13/2008   Qualifier: Diagnosis of  By: Everett Graff    . SYNCOPE 03/29/2010   Qualifier: Diagnosis of  By: Caryl Never MD, Jacoba Cherney     Past Surgical History:  Procedure Laterality Date  . CESAREAN SECTION    . CHOLECYSTECTOMY      reports that she has never smoked. She has never used smokeless tobacco. She reports that she does not drink alcohol or use drugs. family history includes Cancer in an other family member; Heart disease in an other family member; Hypertension in an other family member. Allergies  Allergen Reactions   . Hydrocodone-Acetaminophen     iitching     Review of Systems  Constitutional: Positive for fatigue. Negative for appetite change, chills, fever and unexpected weight change.  Respiratory: Negative for shortness of breath.   Gastrointestinal: Negative for abdominal pain, blood in stool, constipation, diarrhea and vomiting.  Genitourinary: Negative for dysuria.  Musculoskeletal: Positive for arthralgias.  Neurological: Negative for weakness.       Objective:   Physical Exam Vitals signs reviewed.  Constitutional:      Appearance: Normal appearance.  Neck:     Musculoskeletal: Neck supple.  Cardiovascular:     Rate and Rhythm: Normal rate and regular rhythm.  Pulmonary:     Effort: Pulmonary effort is normal.     Breath sounds: Normal breath sounds.  Musculoskeletal:     Right lower leg: No edema.     Left lower leg: No edema.     Comments: She has some mild nodular thickening of the PIP and DIP joints consistent with osteoarthritis  Lymphadenopathy:     Cervical: No cervical adenopathy.  Neurological:     Mental Status: She is alert.        Assessment:     #1 diffuse arthralgias with recent labs revealing no evidence for inflammatory arthritis.  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  Suspect this may be largely related to  very poor sleep quality from frequent nocturia.  #3 Low vitamin D level  #4 chronic urine urgency/frequency     Plan:     -Repeat 25 hydroxy vitamin D level per patient request -Decrease high glycemic food intake -Establish more consistent exercise -Discussed possible options for urinary frequency but she is reluctant to take anticholinergic medications or Myrbetriq at this time -Continue to minimize caffeine intake -Consider natural remedies for inflammation such as tart cherry extract  Eulas Post MD [facility] at [facility]

## 2022-06-07 ENCOUNTER — Other Ambulatory Visit: Payer: Self-pay | Admitting: Family Medicine

## 2022-06-07 DIAGNOSIS — Z1231 Encounter for screening mammogram for malignant neoplasm of breast: Secondary | ICD-10-CM

## 2022-06-12 ENCOUNTER — Ambulatory Visit
Admission: RE | Admit: 2022-06-12 | Discharge: 2022-06-12 | Disposition: A | Discharge status: Home / Self Care | Payer: No Typology Code available for payment source | Source: Ambulatory Visit | Attending: Family Medicine | Admitting: Family Medicine

## 2022-06-12 DIAGNOSIS — Z1231 Encounter for screening mammogram for malignant neoplasm of breast: Secondary | ICD-10-CM

## 2022-07-03 ENCOUNTER — Emergency Department (HOSPITAL_BASED_OUTPATIENT_CLINIC_OR_DEPARTMENT_OTHER)
Admission: EM | Admit: 2022-07-03 | Discharge: 2022-07-03 | Disposition: A | Discharge status: Home / Self Care | Payer: Worker's Compensation | Attending: Emergency Medicine | Admitting: Emergency Medicine

## 2022-07-03 ENCOUNTER — Encounter (HOSPITAL_BASED_OUTPATIENT_CLINIC_OR_DEPARTMENT_OTHER): Payer: Self-pay

## 2022-07-03 ENCOUNTER — Other Ambulatory Visit: Payer: Self-pay

## 2022-07-03 DIAGNOSIS — Z203 Contact with and (suspected) exposure to rabies: Secondary | ICD-10-CM | POA: Insufficient documentation

## 2022-07-03 DIAGNOSIS — Z23 Encounter for immunization: Secondary | ICD-10-CM | POA: Insufficient documentation

## 2022-07-03 DIAGNOSIS — Y990 Civilian activity done for income or pay: Secondary | ICD-10-CM | POA: Diagnosis not present

## 2022-07-03 MED ORDER — RABIES VACCINE, PCEC IM SUSR
1.0000 mL | Freq: Once | INTRAMUSCULAR | Status: AC
  Administered 2022-07-03: 1 mL via INTRAMUSCULAR
  Filled 2022-07-03: qty 1

## 2022-07-03 NOTE — ED Provider Notes (Signed)
[facility] Provider Note   CSN: [id number] Arrival date & time: 07/03/22  1335     History  Chief Complaint  Patient presents with   Rabies Injection    Amy Kaufman is a 52 y.o. female.  HPI   52 year old female presents emergency department with request of rabies vaccination.  Patient works as a Programme researcher, broadcasting/film/video and had recent operation on a cat on 06/19/2022 that subsequently tested positive for rabies after abnormal behavior.  Patient notes exposure to saliva as well as contaminants in cat's wound during procedure.  Patiently is currently with mild cough and nasal congestion of which has been improving.  Denies headache, dizziness, lightheadedness, abdominal pain, nausea, vomiting, fever, chills, night sweats, neck stiffness/rigidity.  Tetanus up-to-date per patient.  Patient given Ig as well as rabies vaccination in the past several years ago.  Past medical history significant for migraine, celiac disease, depression  Home Medications Prior to Admission medications   Medication Sig Start Date End Date Taking? Authorizing Provider  Ascorbic Acid (VITAMIN C) 1000 MG tablet Take 1,000 mg by mouth daily as needed.    [provider]  Cholecalciferol (VITAMIN D3) 125 MCG (5000 UT) CAPS Take 1 capsule by mouth daily as needed.    [provider]  Magnesium 300 MG CAPS Take 1 capsule by mouth daily as needed. May take up to 320 mg daily    [provider]  Vitamin D, Ergocalciferol, (DRISDOL) 1.25 MG (50000 UT) CAPS capsule Take 1 capsule (50,000 Units total) by mouth every 7 (seven) days. 01/12/19   Burchette, Alinda Sierras, MD  vitamin k 100 MCG tablet Take 100 mcg by mouth daily as needed.    [provider]      Allergies    Hydrocodone-acetaminophen    Review of Systems   Review of Systems  All other systems reviewed and are negative.   Physical Exam Updated Vital Signs BP 119/87 (BP Location:  Right Arm)   Pulse 86   Temp 97.8 F (36.6 C)   Resp 16   Ht 5' 9"$  (1.753 m)   Wt 63.5 kg   LMP 10/13/2014   SpO2 98%   BMI 20.67 kg/m  Physical Exam Vitals and nursing note reviewed.  Constitutional:      General: She is not in acute distress.    Appearance: She is well-developed.  HENT:     Head: Normocephalic and atraumatic.  Eyes:     Conjunctiva/sclera: Conjunctivae normal.  Cardiovascular:     Rate and Rhythm: Normal rate and regular rhythm.     Heart sounds: No murmur heard. Pulmonary:     Effort: Pulmonary effort is normal. No respiratory distress.     Breath sounds: Normal breath sounds.  Abdominal:     Palpations: Abdomen is soft.     Tenderness: There is no abdominal tenderness. There is no right CVA tenderness or left CVA tenderness.  Musculoskeletal:        General: No swelling.     Cervical back: Neck supple.  Skin:    General: Skin is warm and dry.     Capillary Refill: Capillary refill takes less than 2 seconds.  Neurological:     Mental Status: She is alert.  Psychiatric:        Mood and Affect: Mood normal.     ED Results / Procedures / Treatments   Labs (all labs ordered are listed, but only abnormal results are displayed) Labs  Reviewed - No data to display  EKG None  Radiology No results found.  Procedures Procedures    Medications Ordered in ED Medications  rabies vaccine (RABAVERT) injection 1 mL (1 mL Intramuscular Given 07/03/22 1437)    ED Course/ Medical Decision Making/ A&P                             Medical Decision Making Risk Prescription drug management.   This patient presents to the ED for concern of rabies exposure, this involves an extensive number of treatment options, and is a complaint that carries with it a high risk of complications and morbidity.  The differential diagnosis includes rabies exposure    Co morbidities that complicate the patient evaluation  See HPI   Additional history  obtained:  Additional history obtained from EMR External records from outside source obtained and reviewed including hospital records   Lab Tests:  N/a   Imaging Studies ordered:  N/a   Cardiac Monitoring: / EKG:  The patient was maintained on a cardiac monitor.  I personally viewed and interpreted the cardiac monitored which showed an underlying rhythm of: Sinus rhythm   Consultations Obtained:  N/a   Problem List / ED Course / Critical interventions / Medication management  Rabies exposure I ordered medication including rabies vaccination   Reevaluation of the patient after these medicines showed that the patient stayed the same I have reviewed the patients home medicines and have made adjustments as needed   Social Determinants of Health:  Denies tobacco, illicit drug use.   Test / Admission - Considered:  Rabies exposure Vitals signs within normal range and stable throughout visit. Patient with complaints concerning for rabies exposure.  Patiently currently asymptomatic.  Patient given rabies Ig as well as vaccination series in the past several years ago.  Given concerning exposure, will treat with rabies vaccination series with 1 dose today and subsequent dose on day 3.  Treatment plan discussed at length with patient and she acknowledged understanding was agreeable to said plan. Worrisome signs and symptoms were discussed with the patient, and the patient acknowledged understanding to return to the ED if noticed. Patient was stable upon discharge.          Final Clinical Impression(s) / ED Diagnoses Final diagnoses:  Rabies exposure    Rx / DC Orders ED Discharge Orders     None         Wilnette Kales, [place] 07/03/22 [place], [place], DO 07/03/22 1606

## 2022-07-03 NOTE — ED Notes (Signed)
Discharge paperwork given and verbally understood. 

## 2022-07-03 NOTE — ED Triage Notes (Signed)
Patient here POV from Home.  Endorses completing preparation for Cat  Surgery Late January. Cat later passed and it was confirmed to be positive Rabies. Sent for Evaluation.   No Discernable Symptoms. No Bite or Scratch but Cat did have open Wound.   NAD Noted during Triage. A&Ox4. GCS 15. Ambulatory.

## 2022-07-03 NOTE — Discharge Instructions (Signed)
Note the workup today was overall reassuring.  As discussed, follow-up in 3 days on 07/06/2022 for second rabies vaccination.  Please do not hesitate to return to emergency department if the worrisome signs and symptoms we discussed become apparent.

## 2022-07-06 ENCOUNTER — Ambulatory Visit
Admission: RE | Admit: 2022-07-06 | Discharge: 2022-07-06 | Discharge status: Home / Self Care | Payer: No Typology Code available for payment source | Source: Ambulatory Visit | Attending: Family Medicine

## 2022-07-06 VITALS — BP 118/79 | HR 75 | Temp 98.4°F | Resp 14

## 2022-07-06 DIAGNOSIS — Z203 Contact with and (suspected) exposure to rabies: Secondary | ICD-10-CM

## 2022-07-06 MED ORDER — RABIES VACCINE, PCEC IM SUSR
1.0000 mL | Freq: Once | INTRAMUSCULAR | Status: AC
  Administered 2022-07-06: 1 mL via INTRAMUSCULAR

## 2022-07-06 NOTE — ED Triage Notes (Signed)
Here for second in series of rabavert  Pt has received Kedrab in past

## 2022-07-25 ENCOUNTER — Ambulatory Visit: Payer: 59
